# Patient Record
Sex: Female | Born: 1980 | Race: White | Hispanic: Yes | Marital: Married | Smoking: Never smoker
Health system: Southern US, Community
[De-identification: ages and names within clinical notes are randomized; demographics above are authoritative.]

## PROBLEM LIST (undated history)

## (undated) ENCOUNTER — Inpatient Hospital Stay (HOSPITAL_COMMUNITY): Payer: Self-pay

## (undated) DIAGNOSIS — O24419 Gestational diabetes mellitus in pregnancy, unspecified control: Secondary | ICD-10-CM

## (undated) DIAGNOSIS — Z789 Other specified health status: Secondary | ICD-10-CM

## (undated) HISTORY — DX: Other specified health status: Z78.9

## (undated) HISTORY — DX: Gestational diabetes mellitus in pregnancy, unspecified control: O24.419

---

## 2008-03-05 ENCOUNTER — Encounter: Payer: Self-pay | Admitting: Family Medicine

## 2008-03-05 ENCOUNTER — Ambulatory Visit: Payer: Self-pay | Admitting: Family Medicine

## 2008-03-05 LAB — CONVERTED CEMR LAB
ABO/RH(D): O POS
Basophils Absolute: 0 10*3/uL (ref 0.0–0.1)
Basophils Relative: 0 % (ref 0–1)
Eosinophils Absolute: 0.3 10*3/uL (ref 0.0–0.7)
Hepatitis B Surface Ag: NEGATIVE
MCHC: 32.7 g/dL (ref 30.0–36.0)
MCV: 87.5 fL (ref 78.0–100.0)
Neutrophils Relative %: 69 % (ref 43–77)
Platelets: 317 10*3/uL (ref 150–400)
RDW: 13.7 % (ref 11.5–15.5)
WBC: 12.1 10*3/uL — ABNORMAL HIGH (ref 4.0–10.5)

## 2008-03-12 ENCOUNTER — Encounter: Payer: Self-pay | Admitting: Family Medicine

## 2008-03-12 ENCOUNTER — Ambulatory Visit: Payer: Self-pay | Admitting: Family Medicine

## 2008-03-12 LAB — CONVERTED CEMR LAB
Chlamydia, DNA Probe: NEGATIVE
GC Probe Amp, Genital: NEGATIVE

## 2008-03-16 ENCOUNTER — Ambulatory Visit: Payer: Self-pay | Admitting: Family Medicine

## 2008-03-26 ENCOUNTER — Ambulatory Visit: Payer: Self-pay | Admitting: Family Medicine

## 2008-04-08 ENCOUNTER — Ambulatory Visit: Payer: Self-pay | Admitting: Family Medicine

## 2008-04-08 ENCOUNTER — Encounter: Payer: Self-pay | Admitting: Family Medicine

## 2008-05-04 ENCOUNTER — Ambulatory Visit: Payer: Self-pay | Admitting: Family Medicine

## 2008-05-04 ENCOUNTER — Encounter: Payer: Self-pay | Admitting: Family Medicine

## 2008-05-04 DIAGNOSIS — K219 Gastro-esophageal reflux disease without esophagitis: Secondary | ICD-10-CM | POA: Insufficient documentation

## 2008-05-05 ENCOUNTER — Encounter: Payer: Self-pay | Admitting: Family Medicine

## 2008-06-03 ENCOUNTER — Ambulatory Visit: Payer: Self-pay | Admitting: Family Medicine

## 2008-06-29 ENCOUNTER — Ambulatory Visit: Payer: Self-pay | Admitting: Family Medicine

## 2008-07-13 ENCOUNTER — Ambulatory Visit: Payer: Self-pay | Admitting: Family Medicine

## 2008-07-13 ENCOUNTER — Encounter: Payer: Self-pay | Admitting: Family Medicine

## 2008-07-13 LAB — CONVERTED CEMR LAB

## 2008-07-21 ENCOUNTER — Ambulatory Visit: Payer: Self-pay | Admitting: Family Medicine

## 2008-07-21 ENCOUNTER — Encounter: Payer: Self-pay | Admitting: Family Medicine

## 2008-07-29 ENCOUNTER — Ambulatory Visit: Payer: Self-pay | Admitting: Family Medicine

## 2008-08-13 ENCOUNTER — Encounter: Payer: Self-pay | Admitting: Family Medicine

## 2008-08-13 ENCOUNTER — Ambulatory Visit: Payer: Self-pay | Admitting: Family Medicine

## 2008-08-13 DIAGNOSIS — K649 Unspecified hemorrhoids: Secondary | ICD-10-CM | POA: Insufficient documentation

## 2008-08-13 LAB — CONVERTED CEMR LAB
Chlamydia, DNA Probe: NEGATIVE
Whiff Test: NEGATIVE

## 2008-08-26 ENCOUNTER — Ambulatory Visit: Payer: Self-pay | Admitting: Family Medicine

## 2008-09-09 ENCOUNTER — Encounter: Payer: Self-pay | Admitting: Family Medicine

## 2008-09-09 ENCOUNTER — Ambulatory Visit: Payer: Self-pay | Admitting: Family Medicine

## 2008-09-09 LAB — CONVERTED CEMR LAB
Chlamydia, DNA Probe: NEGATIVE
GC Probe Amp, Genital: NEGATIVE

## 2008-09-10 ENCOUNTER — Encounter: Payer: Self-pay | Admitting: Family Medicine

## 2008-09-14 ENCOUNTER — Ambulatory Visit: Payer: Self-pay | Admitting: Family Medicine

## 2008-09-21 ENCOUNTER — Ambulatory Visit: Payer: Self-pay | Admitting: Family Medicine

## 2008-09-27 ENCOUNTER — Ambulatory Visit: Payer: Self-pay | Admitting: Family Medicine

## 2008-10-06 ENCOUNTER — Encounter: Payer: Self-pay | Admitting: Family Medicine

## 2008-10-06 ENCOUNTER — Ambulatory Visit: Payer: Self-pay | Admitting: Obstetrics & Gynecology

## 2008-10-06 ENCOUNTER — Ambulatory Visit: Payer: Self-pay | Admitting: Family Medicine

## 2008-10-06 ENCOUNTER — Inpatient Hospital Stay (HOSPITAL_COMMUNITY): Admission: AD | Admit: 2008-10-06 | Discharge: 2008-10-11 | Payer: Self-pay | Admitting: Obstetrics & Gynecology

## 2008-10-08 ENCOUNTER — Encounter: Payer: Self-pay | Admitting: Obstetrics & Gynecology

## 2008-11-25 ENCOUNTER — Ambulatory Visit: Payer: Self-pay | Admitting: Family Medicine

## 2008-12-01 ENCOUNTER — Telehealth: Payer: Self-pay | Admitting: Family Medicine

## 2009-01-28 ENCOUNTER — Ambulatory Visit: Payer: Self-pay | Admitting: Family Medicine

## 2009-02-11 ENCOUNTER — Ambulatory Visit: Payer: Self-pay | Admitting: Family Medicine

## 2009-02-28 ENCOUNTER — Ambulatory Visit: Payer: Self-pay | Admitting: Family Medicine

## 2009-02-28 DIAGNOSIS — L600 Ingrowing nail: Secondary | ICD-10-CM | POA: Insufficient documentation

## 2009-04-18 ENCOUNTER — Encounter: Payer: Self-pay | Admitting: Family Medicine

## 2009-04-18 ENCOUNTER — Ambulatory Visit: Payer: Self-pay | Admitting: Family Medicine

## 2009-04-22 ENCOUNTER — Ambulatory Visit: Payer: Self-pay | Admitting: Family Medicine

## 2009-06-17 ENCOUNTER — Ambulatory Visit: Payer: Self-pay | Admitting: Family Medicine

## 2009-06-23 ENCOUNTER — Ambulatory Visit: Payer: Self-pay | Admitting: Family Medicine

## 2009-06-23 ENCOUNTER — Encounter: Payer: Self-pay | Admitting: Family Medicine

## 2009-06-30 ENCOUNTER — Ambulatory Visit: Payer: Self-pay | Admitting: Family Medicine

## 2009-07-21 ENCOUNTER — Ambulatory Visit: Payer: Self-pay | Admitting: Family Medicine

## 2010-06-06 NOTE — Assessment & Plan Note (Signed)
Summary: remove toenail/per briscoe/eo   Vital Signs:  Patient profile:   30 year old female Height:      61 inches Weight:      125 pounds BMI:     23.70 BSA:     1.55 Temp:     98.5 degrees F Pulse rate:   71 / minute BP sitting:   111 / 73  Vitals Entered By: Jone Baseman CMA (June 23, 2009 8:40 AM) CC: toe nail removal Is Patient Diabetic? No Pain Assessment Patient in pain? yes     Location: toe Intensity: 4   Primary Care Provider:  Delbert Harness  CC:  toe nail removal.  History of Present Illness: 1) Right ingrown 1st toe: Seen on 06/17/09 for re-infected right first toe. First seen  ~ 2 months ago for ingrown toenail w/ swelling and redness x 2 months; had partial toenail removal., started on Bacrtim at that time which "made her feel warm". She was started on Keflex at visit on 06/17/09 but did not take "because her toe felt better". Her sweling and redness and purulent drainage has now increased. Denies fever, chills, emesis.     Habits & Providers  Alcohol-Tobacco-Diet     Tobacco Status: never  Current Medications (verified): 1)  Pre-Natal  Tabs (Prenatal Multivit-Min-Fe-Fa) .... Take One Daily 2)  Keflex 500 Mg Caps (Cephalexin) .... Take One Tablet Three Times A Day For 7 Days.  Allergies (verified): 1)  ! Bactrim  Physical Exam  General:  Well-developed,well-nourished,in no acute distress; alert,appropriate and cooperative throughout examination.  Here today with FOB and infant.  Interpreter present. Skin:  Right: ingrown toenail  on lateral edge of great toe with paronychia with some purulent drainage.  Left: medial nail of edge growing back 3 mm with no  evidence of infection.   No extension of erythema past local area of inflammation.   Impression & Recommendations:  Problem # 1:  INGROWN TOENAIL, INFECTED (ICD-703.0) Removed right 1st toenail lateral edge as above. Follow up one week. Red flags reviewed, instructions in Spanish.    Complete Medication List: 1)  Pre-natal Tabs (Prenatal multivit-min-fe-fa) .... Take one daily  Patient Instructions: 1)  Fue un placer verle hoy.  Se saco' una parte de la una del dedo gordo del pie derecho hoy.  2)  Debe tomar ibuprofeno tabletas de 200mg , tome 2 a 3 tabletas cuando llegue en la casa, para ayudar a Human resources officer despues que la anestesia se le disminuya. 3)  En 24 horas, puede quitar la benda del dedo y Games developer a Chief Financial Officer en agua tibia con sales de [place], tres veces diario. 4)  Si el dedo se pone rojo, si se le sale pus, o si el dolor se le Moore , debe llamarnos [phone] 5)  Elfredia Nevins que venga para una cita en una semana para chequearle el dedo.   6)  FOLLOWUP APPT IN 1 WEEK WITH DR Wallene Huh OR BRISCOE   Procedure Note  Nail Treatment: The patient complains of pain, redness, inflammation, tenderness, swelling, and discharge but denies fever. Date of onset: 04/06/2009 Onset of lesion: 2 months Indication: paronychia Consent signed: yes  Procedure # 1: nail bed removal w/chemical matrixectomy    Region: lateral    Location: right 1st toe    Anesthesia: 5.0 ml 2% lidocaine w/o epinephrine    Comment: Procedure explained w/ risks and benefits. Patient voiced understanding, signed consent. Prep as above. Rubber band tourniquet to aid hemostasis applied for  5 minutes during procedure. Tolerated procedure well.   Cleaned and prepped with: alcohol and betadine Wound dressing: bacitracin and bulky gauze dressing Instructions: RTC in 7-10 days Additional Instructions: as per instruction sheet    Appended Document: Orders Update    Clinical Lists Changes  Orders: Added new Service order of Nail excision, partial or complete, permanent (11750) - Signed

## 2010-06-06 NOTE — Miscellaneous (Signed)
Summary: Procedures consent  Procedures consent   Imported By: De Nurse 07/13/2009 11:17:20  _____________________________________________________________________  External Attachment:    Type:   Image     Comment:   External Document

## 2010-06-06 NOTE — Assessment & Plan Note (Signed)
Summary: FU/KH   Vital Signs:  Patient profile:   30 year old female Height:      61 inches Weight:      124.7 pounds BMI:     23.65 Temp:     98.1 degrees F oral Pulse rate:   109 / minute BP sitting:   123 / 85  (right arm) Cuff size:   regular  Vitals Entered By: Garen Grams LPN (June 30, 2009 2:41 PM) CC: f/u infected toenail Is Patient Diabetic? No Pain Assessment Patient in pain? no        Primary Care Provider:  Delbert Harness  CC:  f/u infected toenail.  History of Present Illness: 1) Right ingrown 1st toe: Seen on 06/23/09 for re-infected right first toe. First seen  ~ 2 months ago for ingrown toenail w/ swelling and redness x 2 months; partial toenail removal on 2/17/ Tolerated procedure well.  Her sweling and redness and purulent drainage has now improved. Denies fever, chills, emesis, pain.     Habits & Providers  Alcohol-Tobacco-Diet     Tobacco Status: never  Allergies: 1)  ! Bactrim  Physical Exam  General:  pleasant, Spanish speaking female, NAD  Extremities:  left great toe s/p lateral partial nail removal w/ matricectomy. Some granulation tissue present. No signs of infection, purulence or erythema or edema.    Impression & Recommendations:  Problem # 1:  INGROWN TOENAIL, INFECTED (ICD-703.0) Assessment Improved  s/p lateral partial 1st toenail removal for above. Improved. Instructions on foot care as per prior instructions. Follow up with PCP as needed.   Orders: [facility]- Est Level  3 (16109)  Complete Medication List: 1)  Pre-natal Tabs (Prenatal multivit-min-fe-fa) .... Take one daily

## 2010-06-06 NOTE — Assessment & Plan Note (Signed)
Summary: depo/bmc  Nurse Visit   Allergies: 1)  ! Bactrim  Medication Administration  Injection # 1:    Medication: Depo-Provera 150mg     Diagnosis: CONTRACEPTIVE MANAGEMENT (ICD-V25.09)    Route: IM    Site: R deltoid    Exp Date: 09/2011    Lot #: [id number]    Mfr: [email or web address]    Comments: next depo due June 2 thru October 20, 2009    Patient tolerated injection without complications    Given by: Theresia Lo RN (July 21, 2009 9:53 AM)  Orders Added: 1)  Depo-Provera 150mg  [J1055] 2)  Admin of Intranasal/Oral Vaccine [phone]   Medication Administration  Injection # 1:    Medication: Depo-Provera 150mg     Diagnosis: CONTRACEPTIVE MANAGEMENT (ICD-V25.09)    Route: IM    Site: R deltoid    Exp Date: 09/2011    Lot #: [id number]    Mfr: [email or web address]    Comments: next depo due June 2 thru October 20, 2009    Patient tolerated injection without complications    Given by: Theresia Lo RN (July 21, 2009 9:53 AM)  Orders Added: 1)  Depo-Provera 150mg  [J1055] 2)  Admin of Intranasal/Oral Vaccine [24401]  Appended Document: depo/bmc

## 2010-06-06 NOTE — Assessment & Plan Note (Signed)
Summary: infected toe nail   Vital Signs:  Patient profile:   30 year old female Height:      61 inches Weight:      124 pounds BMI:     23.51 Temp:     98.2 degrees F oral Pulse rate:   76 / minute BP sitting:   119 / 77  (right arm) Cuff size:   regular  Vitals Entered By: Garen Grams LPN (June 17, 2009 10:24 AM) CC: right great townail infected Is Patient Diabetic? No Pain Assessment Patient in pain? no        Primary Care Provider:  Delbert Harness  CC:  right great townail infected.  History of Present Illness: 30 yo here for re-infected right great toe.  2 months ago was seen in office and had bilateral partial toenail removal for ingrown toenails.  Had been healing well but 2 weeks ago become reinfected.  Doing warm soaks but was unable to get floss or cotton under edge.  Other great toe is growing out well.  no fevers, chills.  Last time was given bactrim and said it made her feel warm.  Habits & Providers  Alcohol-Tobacco-Diet     Tobacco Status: never  Allergies: No Known Drug Allergies PMH-FH-SH reviewed-no changes except otherwise noted  Review of Systems      See HPI General:  Denies chills and fever. Derm:  Complains of changes in color of skin.  Physical Exam  General:  Well-developed,well-nourished,in no acute distress; alert,appropriate and cooperative throughout examination.  Here today with FOB and infant.  Interpreter present. Skin:  Right: ingrown toenail  on lateral edge of great toe with paronychia with some purulent drainage.  Left: medial nail of edge growing back 3 mm with no  evidence of infection.   No extension of erythema past local area of inflammation.   Impression & Recommendations:  Problem # 1:  INGROWN TOENAIL, INFECTED (ICD-703.0) Will add bactrim to medication intolerance list.  Will prescribe keflex today with neosporin.  Will return early next week once calmed for toenail removal.  Precepted with Dr. Mauricio Po.  Her  updated medication list for this problem includes:    Keflex 500 Mg Caps (Cephalexin) .Marland Kitchen... Take one tablet three times a day for 7 days.  Orders: [facility]- Est Level  3 (16109)  Complete Medication List: 1)  Pre-natal Tabs (Prenatal multivit-min-fe-fa) .... Take one daily 2)  Keflex 500 Mg Caps (Cephalexin) .... Take one tablet three times a day for 7 days.  Patient Instructions: 1)  Fue un placer verle hoy.  2)  Para la una encarnada e infectada, recomiendo que enguague el pie en agua tibia con sales curativas [employer], que se compra en la farmacia en un carton que parece un carton de [place]).  Haga esto varias veces por dia.  3)  Puede aplicar un antibiotico, Neosporin, despues de los banos.  Este se puede comprar sin receta.  4)  Le estamos recetando un antibiotico de via oral, Keflex 500mg , que se debe tomar una tableta tres veces por dia (un total de tres tabletas en un dia).   5)  La receta fue mandada a su farmacia: [facility] en [address] y [address]. 6)  Le marcamos una cita para volver a quitarse la una al comienzo de la semana que viene. 7)  MAKE APPT FOR EARLY NEXT WEEK WITH DR Wallene Huh. 8)  EL ANTIBIOTICO QUE UD RECIBIO' LA VEZ PASADA ES BACTRIM, (el otro nombre es sulfamethoxazole-trimethoprim).  Prescriptions: KEFLEX 500 MG CAPS (CEPHALEXIN) take one tablet three times a day for 7 days.  #21 x 0   Entered and Authorized by:   Delbert Harness MD   Signed by:   Delbert Harness MD on 06/17/2009   Method used:   Electronically to        [facility] [address] [id number] (retail)       [address] [place], [place]  [address]       Ph: [phone]       Fax: [phone]   RxID:   [id number]

## 2010-06-23 ENCOUNTER — Encounter: Payer: Self-pay | Admitting: *Deleted

## 2010-08-14 LAB — CBC
HCT: 20 % — ABNORMAL LOW (ref 36.0–46.0)
HCT: 20.9 % — ABNORMAL LOW (ref 36.0–46.0)
HCT: 21 % — ABNORMAL LOW (ref 36.0–46.0)
HCT: 29 % — ABNORMAL LOW (ref 36.0–46.0)
Hemoglobin: 6.6 g/dL — CL (ref 12.0–15.0)
Hemoglobin: 6.8 g/dL — CL (ref 12.0–15.0)
Hemoglobin: 6.9 g/dL — CL (ref 12.0–15.0)
Hemoglobin: 9.4 g/dL — ABNORMAL LOW (ref 12.0–15.0)
MCHC: 32.5 g/dL (ref 30.0–36.0)
MCHC: 32.5 g/dL (ref 30.0–36.0)
MCHC: 32.9 g/dL (ref 30.0–36.0)
MCHC: 33 g/dL (ref 30.0–36.0)
MCV: 72.1 fL — ABNORMAL LOW (ref 78.0–100.0)
MCV: 72.2 fL — ABNORMAL LOW (ref 78.0–100.0)
MCV: 72.4 fL — ABNORMAL LOW (ref 78.0–100.0)
MCV: 72.4 fL — ABNORMAL LOW (ref 78.0–100.0)
Platelets: 222 10*3/uL (ref 150–400)
Platelets: 264 10*3/uL (ref 150–400)
Platelets: 306 10*3/uL (ref 150–400)
Platelets: 306 10*3/uL (ref 150–400)
RBC: 2.78 MIL/uL — ABNORMAL LOW (ref 3.87–5.11)
RBC: 2.9 MIL/uL — ABNORMAL LOW (ref 3.87–5.11)
RBC: 2.9 MIL/uL — ABNORMAL LOW (ref 3.87–5.11)
RBC: 4 MIL/uL (ref 3.87–5.11)
RDW: 17.4 % — ABNORMAL HIGH (ref 11.5–15.5)
RDW: 17.7 % — ABNORMAL HIGH (ref 11.5–15.5)
RDW: 17.9 % — ABNORMAL HIGH (ref 11.5–15.5)
RDW: 18.1 % — ABNORMAL HIGH (ref 11.5–15.5)
WBC: 10.3 10*3/uL (ref 4.0–10.5)
WBC: 12.1 10*3/uL — ABNORMAL HIGH (ref 4.0–10.5)
WBC: 13.5 10*3/uL — ABNORMAL HIGH (ref 4.0–10.5)
WBC: 14.3 10*3/uL — ABNORMAL HIGH (ref 4.0–10.5)

## 2010-08-14 LAB — GC/CHLAMYDIA PROBE AMP, GENITAL
Chlamydia, DNA Probe: NEGATIVE
GC Probe Amp, Genital: NEGATIVE

## 2010-08-14 LAB — RPR: RPR Ser Ql: NONREACTIVE

## 2010-08-14 LAB — WET PREP, GENITAL
Clue Cells Wet Prep HPF POC: NONE SEEN
Trich, Wet Prep: NONE SEEN

## 2010-08-17 LAB — GLUCOSE, CAPILLARY: Glucose-Capillary: 87 mg/dL (ref 70–99)

## 2010-09-19 NOTE — Op Note (Signed)
[name], Cruz       ACCOUNT NO.:  [id number]   MEDICAL RECORD NO.:  [id number]          PATIENT TYPE:  INP   LOCATION:  9111                          FACILITY:  [facility]   PHYSICIAN:  Scheryl Darter, MD       DATE OF BIRTH:  Jul 02, 1980   DATE OF PROCEDURE:  10/08/2008  DATE OF DISCHARGE:                               OPERATIVE REPORT   PREOPERATIVE DIAGNOSES:  1. Intrauterine pregnancy at 59 and 6/7th weeks' gestational age.  2. Cephalopelvic disproportion.  3. Chorioamnionitis.  4. Prolonged rupture of membranes.   POSTOPERATIVE DIAGNOSES:  1. Intrauterine pregnancy at 12 and 6/7th weeks' gestational age.  2. Cephalopelvic disproportion.  3. Chorioamnionitis.  4. Prolonged rupture of membranes.   PROCEDURE:  Primary low-transverse cesarean section.   SURGEON:  Scheryl Darter, MD   ASSISTANT:  Odie Sera, DO   ANESTHESIA:  Epidural.   INDICATIONS FOR PROCEDURE:  Ms. [name] is a 30 year old gravida 1  now para 1-0-0-1 admitted at 58 and 5/7th weeks gestational age with  ruptured membranes.  Her labor progressed slowly over 24 hours and after  3-1/2 hours of pushing efforts with minimal change in station of the  baby, decision was made to apply forceps.  Forceps were applied and with  the first effort forceps, we were unable to move the baby at all through  the birth canal.  The incision was then made to proceed with a cesarean  section due to presumed cephalopelvic disproportion.  Of note, during  the patient's pushing efforts, she developed a temperature and  antibiotics, ampicillin and gentamicin were started.  The patient was  then counseled on the risks and benefits of cesarean section to include,  but not limited to bleeding, infection, and damage to internal organs.  The patient agreed to proceed with the surgery.   DESCRIPTION OF PROCEDURE:  The patient was taken to the operating room  where epidural anesthesia was redosed.  Appropriate anesthesia  was  confirmed.  The patient was then prepped and draped in the usual sterile  manner.  A time-out was conducted.  A Pfannenstiel incision was made in  the skin and continued through the subcutaneous layers down the fascia.  The fascia was incised in the midline.  The fascial incision was  extended laterally using manual traction.  The fascia was then dissected  off the underlying rectus muscles both superior and inferior to the  incision.  The rectus muscles were then separated in the midline with  manual traction.  The peritoneum was entered bluntly and the peritoneal  opening was separated with manual traction.  The bladder blade was  placed.  Appropriate entry to the uterus was obtained.  A transverse  incision was made in the lower uterine segment with a scalpel.  The  incision was carried through the myometrial layers until meconium-  stained amniotic fluid was noted.  The uterine incision was then  extended laterally using manual traction.  The fetal head was grasped  and elevated out of the pelvis.  The bladder blade was removed and the  fetal head was delivered with the assistance of fundal pressure.  The  head delivered in a occiput transverse position and the mouth and nares  were bulb suctioned.  The shoulders followed by rest of corpus were then  delivered without difficulty.  The mouth and nares were bulb suctioned  again.  The cord was clamped and cut and the infant was handed to the  awaiting NICU staff with spontaneous cry, good color, and good tone.  The baby's weight was 9 pounds and 4 ounces and Apgars were 9 and 9.  The placenta then delivered with the assistance of fundal massage.  The  placenta was intact and had three-vessel cord.  The uterus was then  cleared of clots and debris using a dry lap sponge.  The uterine  incision was then closed using a two-layer closure.  The first layer is  a running interlocking stitch using a 0 Vicryl suture.  The second layer   using the same suture was an imbricating layer.  Good hemostasis was  obtained of the uterine incision.  The abdomen was then irrigated with  warm saline.  Again good hemostasis was noted of the uterine incision.  The peritoneum was then closed using 0 Vicryl in a running  noninterlocking fashion.  The fascia was then closed using the same  suture in a running noninterlocking fashion.  The subcutaneous layers  were irrigated with a wet lap sponge.  The skin was then closed using 4-  0 Vicryl in a running subcuticular manner.  Pressure dressing was  applied.  All sponge, instrument, and needle counts were correct x2.   FINDINGS:  1. Meconium-stained amniotic fluid.  2. Viable female infant.  3. Grossly normal bilateral tubes and ovaries.   SPECIMENS:  Placenta.   DISPOSITION:  Pathology.   ESTIMATED BLOOD LOSS:  800 mL.   COMPLICATIONS:  There are no immediate complication.   DISPOSITION:  The patient taken to the PACU in good condition.      Odie Sera, DO      Scheryl Darter, MD  Electronically Signed    MC/MEDQ  D:  10/08/2008  T:  10/08/2008  Job:  [id number]

## 2010-09-22 NOTE — Discharge Summary (Signed)
[name], [name]       ACCOUNT NO.:  [id number]   MEDICAL RECORD NO.:  [id number]          PATIENT TYPE:  INP   LOCATION:  9111                          [facility]   PHYSICIAN:  Horton Chin, MD DATE OF BIRTH:  Jul 01, 1980   DATE OF ADMISSION:  10/06/2008  DATE OF DISCHARGE:  10/11/2008                               DISCHARGE SUMMARY   PRIMARY CARE [name]:  Dr. Delbert Harness at [facility].   DISCHARGE MEDICATIONS:  1. Ibuprofen 600 mg.  2. Iron sulfate 325 mg twice a day.  3. Prenatal vitamins.  4. Depo-Provera shot given on discharge.   DISCHARGE DIAGNOSES:  1. Gravida 1, para 1, delivered a viable female at 26 and 6.  2. Chorioamnionitis.  3. Cephalopelvic disproportion.  4. Status post primary low transverse cesarean section.   PERTINENT LABORATORY DATA:  Hemoglobin on admission was 9.4, on postop  day #1 trended down to 6.8.  On postop day #2 the patient was started on  iron supplements and hemoglobin was trending upward 6.9.  The patient'Erika  anemia due to blood loss during surgery.  The patient was asymptomatic  and vital signs stable prior to discharge.  Mother O+, RPR nonreactive.   HOSPITAL COURSE:  This is 30 year old G1, P0, who presented to [facility] at 39 and 5 weeks with premature rupture of membranes possibly  3 days prior to admission.  The patient was admitted with expectant  management and when she failed to progress, her labor was augmented with  Cytotec and then Pitocin.  The patient had a slow progression of active  labor with good fetus tolerated of labor.  The mother was noted to be  afebrile ampicillin and gentamicin were started for presumed  chorioamnionitis.  After 24 hours of labor and 3-1/2 hours of pushing  with no significant change in the baby'Erika station, forceps were applied  with no descent of the fetus.  It was decided that the patient would  proceed with cesarean section.   Please see dictated  operative report for full details.  The patient'Erika  postpartum course was notable only for a decrease in her hemoglobin  postoperatively with an estimated blood loss of 800 mL.  The patient was  asymptomatic and thus  was not transfused.  Hemoglobin showed an upward trend prior to  discharge.  The patient was discharged on POD #3 before her baby as the  infant was transferred to NICU for respiratory distress.  The mother  plans to breastfeed.   The patient to follow up at [facility] for postpartum  appointment on July 22 at 9 a.m.      Delbert Harness, MD      Horton Chin, MD  Electronically Signed    KB/MEDQ  D:  10/21/2008  T:  10/21/2008  Job:  [id number]

## 2011-11-29 ENCOUNTER — Other Ambulatory Visit: Payer: Self-pay | Admitting: Family Medicine

## 2011-11-29 ENCOUNTER — Other Ambulatory Visit: Payer: Self-pay

## 2011-11-29 DIAGNOSIS — Z3490 Encounter for supervision of normal pregnancy, unspecified, unspecified trimester: Secondary | ICD-10-CM

## 2011-11-29 NOTE — Progress Notes (Unsigned)
Prenatal labs drawn and sent to [facility]

## 2011-11-30 LAB — OBSTETRIC PANEL
Antibody Screen: NEGATIVE
Eosinophils Absolute: 0.3 10*3/uL (ref 0.0–0.7)
Eosinophils Relative: 3 % (ref 0–5)
Lymphs Abs: 2 10*3/uL (ref 0.7–4.0)
MCH: 28.7 pg (ref 26.0–34.0)
MCV: 83.6 fL (ref 78.0–100.0)
Monocytes Absolute: 0.7 10*3/uL (ref 0.1–1.0)
Platelets: 312 10*3/uL (ref 150–400)
RBC: 4.15 MIL/uL (ref 3.87–5.11)
Rh Type: POSITIVE

## 2011-12-01 LAB — CULTURE, OB URINE
Colony Count: NO GROWTH
Organism ID, Bacteria: NO GROWTH

## 2011-12-06 ENCOUNTER — Encounter: Payer: Self-pay | Admitting: Family Medicine

## 2011-12-12 ENCOUNTER — Encounter: Payer: Self-pay | Admitting: Family Medicine

## 2011-12-12 ENCOUNTER — Ambulatory Visit (INDEPENDENT_AMBULATORY_CARE_PROVIDER_SITE_OTHER): Payer: Self-pay | Admitting: Family Medicine

## 2011-12-12 ENCOUNTER — Other Ambulatory Visit (HOSPITAL_COMMUNITY)
Admission: RE | Admit: 2011-12-12 | Discharge: 2011-12-12 | Disposition: A | Discharge status: Home / Self Care | Payer: Self-pay | Source: Ambulatory Visit | Attending: Family Medicine | Admitting: Family Medicine

## 2011-12-12 VITALS — BP 108/68 | Temp 98.0°F | Wt 127.0 lb

## 2011-12-12 DIAGNOSIS — Z01419 Encounter for gynecological examination (general) (routine) without abnormal findings: Secondary | ICD-10-CM | POA: Insufficient documentation

## 2011-12-12 DIAGNOSIS — Z3480 Encounter for supervision of other normal pregnancy, unspecified trimester: Secondary | ICD-10-CM

## 2011-12-12 DIAGNOSIS — Z113 Encounter for screening for infections with a predominantly sexual mode of transmission: Secondary | ICD-10-CM | POA: Insufficient documentation

## 2011-12-12 DIAGNOSIS — Z3490 Encounter for supervision of normal pregnancy, unspecified, unspecified trimester: Secondary | ICD-10-CM

## 2011-12-12 DIAGNOSIS — O0990 Supervision of high risk pregnancy, unspecified, unspecified trimester: Secondary | ICD-10-CM | POA: Insufficient documentation

## 2011-12-12 NOTE — Progress Notes (Signed)
  Subjective:    [name] is being seen today for her first obstetrical visit.  This is a planned pregnancy. She is at [redacted]w[redacted]d gestation. Her obstetrical history is significant for advanced maternal age. Relationship with FOB: spouse, living together. Patient does intend to breast feed. Pregnancy history fully reviewed.  Patient reports nausea, no bleeding, no contractions, no cramping and no leaking.  Review of Systems:   Review of Systems  Objective:     BP 108/68  Temp 98 F (36.7 C)  Wt 127 lb (57.607 kg)  LMP 08/24/2011 Physical Exam  Exam    Assessment:    Pregnancy: G2P1000 Patient Active Problem List  Diagnosis  . HEMORRHOIDS  . GASTROESOPHAGEAL REFLUX DISEASE  . Pregnancy  Gen: NAD, slightly overweight HEENT: MMM, EOMI, PERRL, no adenopathy CV: RRR, no murmurs Resp: CTABL Abd: SNTND, gravid Genital: Normal external/internal female genitalia.  Without lesions.  Cervix is parous with no friability or CMT.  It is significantly posterior. Ext: No edema, 2+ pulses     Plan:     Initial labs drawn. Prenatal vitamins. Problem list reviewed and updated. AFP3 discussed: undecided. Role of ultrasound in pregnancy discussed; fetal survey: requested. Amniocentesis discussed: not indicated. Follow up in 5 weeks. 50% of 60 min visit spent on counseling and coordination of care.    Kealani Leckey 12/12/2011

## 2011-12-12 NOTE — Progress Notes (Signed)
Patient was supposed to get 1 hour GTT due to h/o DM in family but left before this was performed.

## 2011-12-12 NOTE — Patient Instructions (Addendum)
Embarazo - Primer trimestre (Pregnancy - First Trimester) Durante el acto sexual, millones de espermatozoides ingresan a la vagina. Slo Education administrator y Economist en vulo femenino mientras se encuentre en las Trompas de [place]. Una semana despus, el huevo fertilizado se implanta en la pared del tero. Un embrin comienza a desarrollarse y se convierte en un beb. Entre la 6 y la 8 semanas se forman los ojos y el rostro y el latido cardaco se detecta con Nurse, adult. Hacia el final de la 12 semana (primer trimestre) se han formado todos los rganos del beb. Ahora que est embarazada, desear hacer todo lo posible para tener un beb sano. Dos de las cosas ms importantes son: Winferd Humphrey buena atencin prenatal y seguir las indicaciones del profesional que la asiste. La atencin prenatal incluye toda la asistencia mdica que usted recibe antes del nacimiento del beb. Se lleva a cabo para prevenir problemas durante el embarazo y [place]. EXAMENES PRENATALES  Durante los controles prenatales, le tomarn la presin arterial, verificarn su peso y [place] harn [place] de [place]. Todo esto se realiza para asegurar que usted progresa de [place] normal y Office manager.   Una mujer embarazada ganar de 25 a 35 libras (8 a 12 kilos) Academic librarian. Sin embargo, si tiene sobrepeso o bajo peso el mdico le aconsejar acerca de [place].   Entre los [employer] solicitarn anlisis de [place], cultivos de cuello de [place], [place] y [place]. Estas pruebas se realizan para controlar su salud y la del beb. Se recomienda realizar todos los [employer] y tambin pruebas para la deteccin del VIH, si usted lo autoriza. Este es el virus que causa el [place]. Estas pruebas se realizan porque existen medicamentos que podrn administrarle para prevenir que el beb nazca con esta infeccin, si usted estuviera infectada y no lo supiera. Tambin se solicitan anlisis de sangre para conocer su  grupo sanguneo, infecciones previas y para Chief Operating Officer los glbulos rojos (hemoglobina).   Un bajo nivel de hemoglobina (anemia) es frecuente durante el embarazo. Para prevenirla, se administran hierro y vitaminas. En etapas posteriores del [employer] solicitarn anlisis de sangre para descartar diabetes y otros anlisis para Sales promotion account executive otros problemas. Los anlisis son necesarios para verificar que usted y el beb estn bien.   Necesitar otras pruebas que se realizan para asegurarse de que el beb est saludable.  CAMBIOS DURANTE EL PRIMER TRIMESTRE (LOS TRES PRIMEROS MESES DEL EMBARAZO). Su organismo atravesar numerosos cambios Academic librarian. Estos pueden variar de Neomia Dear persona a otra. Converse con el profesional que la asiste acerca los cambios que usted note y que la preocupen.  El perodo menstrual se detiene.   El vulo y los espermatozoides llevan los genes que determinan cmo seremos. Sus genes y los de su pareja forman un beb. Los genes del varn determinan si ser un nio o una nia.   No tome ningn medicamento a menos que se lo haya indicado el profesional que la asiste.   Aumentar la circunferencia de su cuerpo y se sentir hinchada.   Podr sentir nuseas o vmitos. Si los vmitos son incontrolables, comunquese con el profesional que la asiste.   Sus mamas comenzarn a agrandarse y estarn ms sensibles.   Los pezones salen hacia afuera y se oscurecen.   Mayor necesidad de Geographical information systems officer. Si siente dolor al orinar podra tener una infeccin urinaria.   Cansarse fcilmente.   Prdida del apetito.   Retorcijones por ciertos tipos de alimentos.  Al principio, podr ganar o perder algo de peso.   Podr sentir cambios emocionales cada da (alegra por el embarazo o preocupacin acerca de que haya algo mal en el embarazo o con el beb).   Podr tener sueos ms vvidos y fuertes.  INSTRUCCIONES PARA EL CUIDADO DOMICILIARIO  Es extremadamente importante que evite el  cigarrillo, el alcohol y las drogas no prescriptas durante el embarazo. Estas sustancias qumicas afectan la formacin y el desarrollo del beb. Evite estas sustancias qumicas durante todo el embarazo para asegurar el nacimiento de un beb sano.   Comience las consultas prenatales alrededor de la 12 semana de embarazo. Generalmente se programan cada mes al principio y se hacen ms frecuentes en los 2 ltimos meses antes del parto. Cumpla con las citas tal como se le indic. Siga las indicaciones del profesional con respecto a los medicamentos que toma, los anlisis de laboratorio, la actividad fsica y la dieta.   Durante el embarazo debe obtener nutrientes para usted y para su beb. Consuma alimentos balanceados. Elija alimentos como carne, pescado, leche y otros productos lcteos descremados, vegetales, frutas, panes integrales y cereales. El profesional le informar cul es el aumento de peso ideal.   Las nuseas matinales pueden aliviarse si come algunas galletitas saladas en la cama. Coma dos galletitas antes de levantarse por la maana. Tambin puede comer galletitas sin sal.   Tome 4  5 comidas pequeas por da en lugar de 3 comidas abundantes para evitar las nuseas y los vmitos.   Tambin puede prevenir las nuseas bebiendo lquidos entre comidas en lugar de hacerlo mientras come.   Si no tiene problemas, puede continuar manteniendo relaciones sexuales a lo largo de todo el embarazo. Algunos problemas pueden ser la prdida prematura de lquido amnitico de las membranas, hemorragias vaginales o dolor en el abdomen.   Realice actividad fsica todos los das, si no tiene restricciones. Consulte con el profesional que la asiste o con un fisioterapeuta si no sabe con certeza si determinados ejercicios son seguros. En los dos ltimos trimestres del embarazo puede ocurrir un aumento de peso mayor. La actividad fsica ayudar a:   Controlar su peso.   Mantenerse en forma.   Prepararse para  el parto y el trabajo de parto.   Ayuda a perder el peso ganado en el embarazo luego del parto.   Use un buen sostn o como los que se usan para hacer deportes para aliviar la sensibilidad de las mamas. Tambin puede serle til si lo usa mientras duerme.   Consulte cuando tendr el curso preparatorio para el parto. Comience las clases cuando se las ofrezcan.   No utilice la baera con agua caliente, baos turcos y saunas.   Colquese el cinturn de seguridad cuando conduzca. Este la proteger a usted y al beb en caso de accidente.   Evite comer carne cruda y el contacto con los utensilios y desperdicios de los gatos. Estos elementos contienen grmenes que pueden causar defectos de nacimiento en el beb.   El primer trimestre es un buen momento para visitar a su dentista y evaluar su salud dental. Es importante mantener los dientes limpios. Use un cepillo de dientes suave y cepllese con ms suavidad durante el embarazo.   Pida ayuda si tiene necesidades econmicas, de asesoramiento o nutricionales durante el embarazo. El profesional podr ayudarla con respecto a estas necesidades, o derivarla a otros especialistas.   No tome ningn medicamento a menos que se lo haya indicado el profesional que la   asiste.   Informe al profesional que lo asiste si es vctima de algn tipo de Advertising copywriter, ya sea mental o fsica.   Haga una lista de nmeros de telfono de Associate Professor de familiares, amigos, hospital, polica y bomberos.   Anote sus preguntas. Llvelas con usted en su prxima visita de control.   No utilice duchas vaginales.   No cruce las piernas.   Si ha estado parada durante largos perodos de [place], mueva los pies o realice pequeos pasos en crculo.   Podr tener secreciones vaginales que pueden requerir una compresa o [place]. No utilice tampones ni compresas con perfume.  EL CONSUMO DE MEDICAMENTOS Y DROGAS DURANTE EL EMBARAZO  Tome las vitaminas apropiadas para esta  etapa tal como se le indic. Las vitaminas deben contener 1 miligramo de cido flico. Guarde todas las vitaminas fuera del alcance de los nios. La ingestin de slo un par de vitaminas o tabletas que contengan hierro pueden ocasionar la [employer] en un beb o en un nio pequeo.   Evite el uso de medicamentos, inclusive hierbas, que no hayan sido prescriptos o indicados por el profesional que la asiste. Utilice los medicamentos de venta libre o de prescripcin para Chief Technology Officer, Environmental health practitioner o la [place], segn se lo indique el profesional que lo asiste. No utilice aspirina, ibuprofeno o naproxeno a menos que el profesional la autorice.   El consumo de alcohol se relaciona con ciertos defectos de nacimiento. Entre ellos el sndrome de alcoholismo fetal. Debe evitar el consumo de alcohol en cualquiera de sus formas. El cigarrillo causa nacimientos prematuros y bebs de [place].   Las drogas ocasionan terribles daos al beb. Estn absolutamente prohibidas. Un beb que nace de [employer], ser adicto al nacer. Ese beb tendr los mismos sntomas de abstinencia que un adulto.   No consuma drogas. Pueden daar gravemente al beb.   Converse con el mdico acerca de cualquier medicamento que est tomando y la razn por la cual los toma.  EL ABORTO ESPONTNEO ES UNA SITUACIN FRECUENTE Esto no significa que ha hecho las cosas mal. No es un motivo para preocuparse en caso de un nuevo embarazo. El profesional que la asiste la ayudar si tiene preguntas para formular. Si ha sufrido un aborto espontneo, Pension scheme manager someterse a Futures trader de curetaje, SOLICITE ATENCIN MDICA SI: Tiene alguna preocupacin durante el embarazo. Es mejor que llame para formular las preguntas si no puede esperar hasta la prxima visita, que sentirse preocupada por ellas. SOLICITE ATENCIN MDICA DE INMEDIATO SI:  La temperatura oral se eleva sin motivo por encima de 102 F (38.9 C) o segn le indique el profesional que lo  asiste.   Tiene una prdida de lquido por la vagina (canal de parto). Si sospecha una ruptura de las [place], tmese la temperatura y llame al profesional para informarlo sobre esto.   Observa unas pequeas manchas o una hemorragia vaginal. Notifique al profesional acerca de la cantidad y de cuntos apsitos est utilizando.   Presenta un olor desagradable en la secrecin vaginal y observa un cambio en el color.   Contina con las nuseas y no obtiene alivio de los remedios indicados. Vomita sangre o algo similar a la borra del caf.   Pierde ms de 1 Kg de peso en [employer].   Aumenta ms de 1 Kg en una semana y [address], las manos, los pies o las piernas hinchados.   Aumenta ms de 2,5 Kg en una semana (aunque  no tenga las manos, pies, piernas o el rostro hinchados).   Ha estado expuesta a la rubola y no ha sufrido la enfermedad. Ha estado expuesta a la quinta enfermedad o a la varicela.   Ha estado expuesta a la quinta enfermedad o a la varicela.   Presenta dolor abdominal. Las molestias en el ligamento redondo son Neomia Dear causa benigna (no cancerosa) frecuente de dolor abdominal durante el embarazo. El profesional que la asiste deber evaluarla.   Presenta dolor de Renne Musca, diarrea, dolor al orinar o le falta la respiracin.   Sufre una cada, un accidente automovilstico o algn traumatismo.   Sufre violencia fsica o mental en su hogar.  Document Released: 01/31/2005 Document Revised: 04/12/2011 [facility] Patient Information 2012 [place], [place].Psychiatrist (Pregnancy) Si planea quedar embarazada, es una buena idea concertar una cita de preconcepcin con el mdico para poder lograr un estilo de vida saludable ante de quedar embarazada. Esto incluye dieta, peso, ejercicio, el tomar vitaminas prenatales en especial cido flico (ayuda a prevenir defectos en el cerebro y la mdula espinal), evitar el alcohol, fumar, las drogas ilegales, problemas mdicos (diabetes,  convulsiones), historial familiar de problemas genticos, condiciones de trabajo e inmunizaciones. Es mejor tener conocimiento de estas cosas y Radio producer algo antes de quedar embarazada. Si est embarazada, es necesario que siga ciertas pautas para tener un beb sano. Es muy importante Education officer, environmental controles prenatales adecuados y seguir las indicaciones del profesional que la asiste. La atencin prenatal incluye toda la asistencia mdica que usted recibe antes del nacimiento del beb. Esto ayuda a Publishing copy y [place]. INSTRUCCIONES PARA EL CUIDADO DOMICILIARIO  Comience las consultas prenatales alrededor de la 12 semana de embarazo o lo antes posible. Al principio generalmente se programan cada mes. Se hacen ms frecuentes en los 2 ltimos meses antes del parto. Es importante que concurra a todas las citas con el profesional y siga sus instrucciones con Camera operator a los medicamentos que deba Chemical engineer, a la actividad fsica y a Psychologist, forensic.   Durante el embarazo debe obtener nutrientes para usted y para su beb. Consuma una dieta normal y bien balanceada. Elija alimentos como carne, pescado, [place] y otros productos lcteos, vegetales, frutas, panes integrales y Research officer, trade union cul es el aumento de [place] ideal, segn su peso y Risk analyst. Beba gran cantidad de lquidos. Trate de beber 8 vasos de lquidos por Futures trader.   El alcohol se asocia a cierto nmero de defectos del nacimiento, incluyendo el sndrome de alcoholismo fetal. Lo mejor es evitarlo completamente El cigarrillo causa nacimientos prematuros y bebs de bajo peso al Psychologist, clinical. El consumo de alcohol y nicotina durante el embarazo tambin aumentan marcadamente la probabilidad de que el nio sea qumicamente dependiente en etapas posteriores de su vida y puede contribuir al sndrome de muerte sbita infantil (SMSI)   No consuma drogas.   Solo tome medicamentos prescriptos o de venta libre que le haya recomendado  el profesional. Algunos medicamentos pueden causar problemas genticos y fsicos al beb   Las nuseas matinales pueden aliviarse si come Chiropodist en la cama. Coma dos galletitas antes de levantarse por la maana.   Las relaciones sexuales pueden continuarse hasta casi el final del embarazo, si no se presentan otros problemas como prdida prematura (antes de tiempo) de lquido amnitico, Restaurant manager, fast food vaginal, dolor durante las relaciones sexuales o dolor abdominal (en el vientre).   Practique ejercicios con regularidad. Consulte con el profesional que la asiste si no [place]  con certeza si determinados ejercicios son seguros.   No utilice la baera con agua caliente, baos turcos y saunas. Estos aumentan el riesgo de sufrir un desmayo o de prdida del conocimiento, y as Runner, broadcasting/film/video usted o el beb. La natacin es un buen ejercicio. Descanse todo lo que pueda e incluya una siesta despus de almorzar siempre que le sea posible, especialmente durante el tercer trimestre.   Evite los olores y las sustancias qumicas txicas.   No use zapatos de tacones altos, podra perder el equilibrio y caer.   No levante objetos de ms de 2,5 kg. Si levanta un objeto, flexione las piernas y los muslos, y no la espalda.   Evite los viajes largos, Paramedic trimestre.   Si debe viajar fuera de la ciudad o de su estado, lleve una copia de la historia clnica.  SOLICITE ATENCIN MDICA DE INMEDIATO SI:  La temperatura oral se eleva sin motivo por encima de 102 F (38.9 C) o segn le indique el profesional que lo asiste.   Tiene una prdida de lquido por la vagina. Si sospecha una ruptura de las [place], tmese la temperatura y llame al profesional para informarlo sobre esto.   Observa unas pequeas manchas o una hemorragia vaginal Notifique al profesional acerca de la cantidad y de cuntos apsitos est utilizando.   Contina teniendo nuseas y no obtiene alivio de los  [facility] han [place], o vomita sangre o una sustancia similar a la borra del caf.   Presenta un dolor en la zona superior del abdomen.   Siente molestias en el ligamento redondo en la parte abdominal baja. El profesional que la asiste Hydrologist.   Siente pequeas contracciones del tero (matriz)   No siente que el beb se mueve, o percibe menos movimientos que antes.   Siente dolor al [employer].   Brett Fairy hemorragia vaginal anormal.   Tiene diarrea persistente.   Sufre una cefalea grave.   Tiene problemas visuales.   Comienza a sentir debilidad muscular.   Se siente mareada o sufre un desmayo.   Comienza a sentir falta de aire.   Siente dolor en el pecho.   Sufre dolor en la espalda que se irradia hacia la pierna y el pie.   Siente latidos cardacos irregulares o la frecuencia cardaca es muy rpida.   Aumenta excesivamente de peso en un perodo breve (2,5 kg en 3 a 5 das)   Se ve envuelta en una situacin de violencia domstica.  Document Released: 01/31/2005 Document Revised: 04/12/2011 [facility] Patient Information 2012 [place], [place].

## 2011-12-17 ENCOUNTER — Encounter: Payer: Self-pay | Admitting: Family Medicine

## 2011-12-27 ENCOUNTER — Telehealth: Payer: Self-pay | Admitting: Family Medicine

## 2011-12-27 NOTE — Telephone Encounter (Signed)
Pt called to canceled [place] @ Dr. Gaynell Face because on Monday pt will have OC from [facility].  Pt will came to let us scan OC for future [place] appt. [employer]

## 2011-12-31 ENCOUNTER — Telehealth: Payer: Self-pay | Admitting: Family Medicine

## 2011-12-31 NOTE — Telephone Encounter (Signed)
Pt needs Sch a [place] in [facility], pt has OC and will bring the OC to be scan for our record.  [employer]

## 2012-01-01 NOTE — Addendum Note (Signed)
Addended by: Jimmy Footman K on: 01/01/2012 12:14 PM   Modules accepted: Orders

## 2012-01-01 NOTE — Telephone Encounter (Signed)
I set up patient an appointment for Friday 8/30 @ 8:00am patient to arrive @ 7:45am @ [facility]. They are going to schedule an interpreter for her.

## 2012-01-04 ENCOUNTER — Ambulatory Visit (HOSPITAL_COMMUNITY)
Admission: RE | Admit: 2012-01-04 | Discharge: 2012-01-04 | Disposition: A | Discharge status: Home / Self Care | Payer: Self-pay | Source: Ambulatory Visit | Attending: Family Medicine | Admitting: Family Medicine

## 2012-01-04 DIAGNOSIS — Z3689 Encounter for other specified antenatal screening: Secondary | ICD-10-CM | POA: Insufficient documentation

## 2012-01-04 DIAGNOSIS — Z3490 Encounter for supervision of normal pregnancy, unspecified, unspecified trimester: Secondary | ICD-10-CM

## 2012-01-09 ENCOUNTER — Encounter: Payer: Self-pay | Admitting: Family Medicine

## 2012-01-16 ENCOUNTER — Encounter: Payer: Self-pay | Admitting: Family Medicine

## 2012-01-16 NOTE — Progress Notes (Signed)
Note reviewed.  Agree with Dr. Louanne Belton.  Following issues noted: Risk factors for diabetes: +FH DM and h/o macrosomic baby.  Pt left before early glucola could be done.  Will get ASAP. H/o section for FTP.  Will need delivery planning with OBs at 30 weeks. Undecided about genetic testing.  Will review options.   Dating: 20 week ultrasound differs from LMP dating by 1 week, 1 day.  If reliable LMP, will continue to use LMP dating.

## 2012-01-17 ENCOUNTER — Ambulatory Visit (INDEPENDENT_AMBULATORY_CARE_PROVIDER_SITE_OTHER): Payer: Self-pay | Admitting: Family Medicine

## 2012-01-17 VITALS — BP 109/64 | Temp 98.1°F | Wt 139.9 lb

## 2012-01-17 DIAGNOSIS — Z23 Encounter for immunization: Secondary | ICD-10-CM

## 2012-01-17 DIAGNOSIS — Z3490 Encounter for supervision of normal pregnancy, unspecified, unspecified trimester: Secondary | ICD-10-CM

## 2012-01-17 LAB — GLUCOSE, CAPILLARY: Comment 1: 1

## 2012-01-17 NOTE — Progress Notes (Unsigned)
31 yo G2P1 here for follow up visit.  No complaints or questions at this time.  See flow sheet for details.  Fundus at umblicus. Dating confirmed - sure LMP. Declined genetics screening. H/O section in 2010 - will need appt with OB to discuss delivery planning at 30 weeks. + H/O macrosomic baby and first degree relative with diabetes - will do 1 hour glucola today. Flu shot today.

## 2012-01-17 NOTE — Patient Instructions (Signed)
It was nice to meet you today. We will give you a flu shot today to protect you and your baby.  We will check the one hour diabetes test today.  If it is normal, we will need to repeat in about 8 weeks. Try to walk for exercise and cut down on sugary foods. Everything looks great.  We will make another appointment with Dr. Louanne Belton in 4 weeks.  Fue Optometrist. Le daremos una vacuna contra la gripe hoy para proteger a usted ya su beb. Comprobaremos la prueba de la diabetes una hora hoy. Si es normal, tendremos que repetir en unas 8 semanas. Trate de caminar para hacer ejercicio y reducir el consumo de alimentos azucarados. Todo se ve [employer]. Vamos a hacer otra cita con el doctor Ritch en 4 semanas.

## 2012-01-23 ENCOUNTER — Other Ambulatory Visit: Payer: Self-pay

## 2012-01-23 DIAGNOSIS — Z3490 Encounter for supervision of normal pregnancy, unspecified, unspecified trimester: Secondary | ICD-10-CM

## 2012-01-23 NOTE — Progress Notes (Signed)
3 HR GTT DONE TODAY [name] 

## 2012-01-24 LAB — GLUCOSE TOLERANCE, 3 HOURS
Glucose Tolerance, 1 hour: 153 mg/dL (ref 70–189)
Glucose Tolerance, 2 hour: 174 mg/dL — ABNORMAL HIGH (ref 70–164)
Glucose, GTT - 3 Hour: 145 mg/dL — ABNORMAL HIGH (ref 70–144)

## 2012-01-25 ENCOUNTER — Telehealth: Payer: Self-pay | Admitting: Family Medicine

## 2012-01-25 ENCOUNTER — Other Ambulatory Visit: Payer: Self-pay | Admitting: Family Medicine

## 2012-01-25 DIAGNOSIS — O24419 Gestational diabetes mellitus in pregnancy, unspecified control: Secondary | ICD-10-CM

## 2012-01-25 DIAGNOSIS — O99810 Abnormal glucose complicating pregnancy: Secondary | ICD-10-CM | POA: Insufficient documentation

## 2012-01-25 NOTE — Telephone Encounter (Signed)
Specifically the high risk OB clinic at [facility].

## 2012-01-25 NOTE — Progress Notes (Signed)
GTT failed.  Will refer to [facility]OB

## 2012-01-25 NOTE — Telephone Encounter (Signed)
Miranes,  Can you please call and schedule an appointment for patient in OB clinic.  Thank you,Ayriel Texidor

## 2012-01-25 NOTE — Telephone Encounter (Signed)
It would appear that the patient is developing pregnancy-related diabetes so she needs to be seen by our OB-GYNs so we make sure everything goes well with her pregnancy.  Could you please call her and inform her of this?

## 2012-01-29 NOTE — Telephone Encounter (Signed)
Her appointment has been made on 02/04/2012 at 7:30am.  Could you please inform her of this?  Thanks!

## 2012-01-30 NOTE — Telephone Encounter (Signed)
Pt is aware about her appt @ [facility]. [employer]

## 2012-02-04 ENCOUNTER — Encounter: Payer: Self-pay | Attending: Family Medicine | Admitting: Dietician

## 2012-02-04 ENCOUNTER — Ambulatory Visit (INDEPENDENT_AMBULATORY_CARE_PROVIDER_SITE_OTHER): Payer: No Typology Code available for payment source | Admitting: Obstetrics & Gynecology

## 2012-02-04 VITALS — Ht 61.5 in | Wt 141.7 lb

## 2012-02-04 DIAGNOSIS — Z713 Dietary counseling and surveillance: Secondary | ICD-10-CM | POA: Insufficient documentation

## 2012-02-04 DIAGNOSIS — O99810 Abnormal glucose complicating pregnancy: Secondary | ICD-10-CM | POA: Insufficient documentation

## 2012-02-04 NOTE — Progress Notes (Signed)
Nutrition Note: (1st visit- RD/CDR only) Pt seen for GDM diet education. Pt has gained 16.7# @ [redacted]w[redacted]d, which is wnl. Pt reports eating 3 meals & 2 snacks/d. Pt drinks water & soy milk. Pt is taking PNV. Pt reports no N&V. Pt given verbal & written GDM education. Disc importance of BF. Disc wt gain goals of 25-35# total. Pt agrees to follow GDM diet including 3 meals & 3 snacks with proper CHO/ protein combination. Pt does not receive WIC but plans to apply. Pt does plan to BF. F/u in 2-4 wks Blondell Reveal, MS, RD, LDN

## 2012-02-04 NOTE — Progress Notes (Signed)
Diabetes Education:  Seen today for new onset GDM.  No GDM with the first pregnancy.  Completed diet review for the carb restricted diet for GDM.  Assisted by Dorie the Spanish interpreter.  Provided handouts in Spanish Nutrition, Diabetes and Pregnancy, along with the Carbohydrate Counting booklet in Spanish. Provided a True Track meter OZH:[id number] Exp: 2013/12/04  Box or lancets: Lot [id number] Exp: 2015/10/08  And 1 box strips Lot: [id number] EXp: 2014/04/03.  Completed a return demonstration for the BGM.  Glucose approx. 2 hr post-meal was 86 mg.  Instructed to bring glucose log and meter to each clinic appointment.  [name] Demetric Dunnaway, RN, RD, CDE

## 2012-02-11 ENCOUNTER — Ambulatory Visit (INDEPENDENT_AMBULATORY_CARE_PROVIDER_SITE_OTHER): Payer: Self-pay | Admitting: Family Medicine

## 2012-02-11 ENCOUNTER — Encounter: Payer: Self-pay | Admitting: Family Medicine

## 2012-02-11 VITALS — BP 104/70 | Temp 97.8°F | Wt 141.2 lb

## 2012-02-11 DIAGNOSIS — O99810 Abnormal glucose complicating pregnancy: Secondary | ICD-10-CM

## 2012-02-11 DIAGNOSIS — O24419 Gestational diabetes mellitus in pregnancy, unspecified control: Secondary | ICD-10-CM

## 2012-02-11 DIAGNOSIS — Z3490 Encounter for supervision of normal pregnancy, unspecified, unspecified trimester: Secondary | ICD-10-CM

## 2012-02-11 LAB — POCT URINALYSIS DIP (DEVICE)
Bilirubin Urine: NEGATIVE
Glucose, UA: 100 mg/dL — AB
Nitrite: NEGATIVE
Urobilinogen, UA: 0.2 mg/dL (ref 0.0–1.0)

## 2012-02-11 MED ORDER — TETANUS-DIPHTH-ACELL PERTUSSIS 5-2.5-18.5 LF-MCG/0.5 IM SUSP
0.5000 mL | Freq: Once | INTRAMUSCULAR | Status: AC
  Administered 2012-02-11: 0.5 mL via INTRAMUSCULAR

## 2012-02-11 NOTE — Progress Notes (Signed)
  Subjective:    [name] is being seen today for her first obstetrical visit at [facility]. She has been seen at [facility] but was transferred for gestational diabetes. She did have a macrosomic baby that was delivered by c-section for failure to progress (thinks she reached 7 cm dilation).  This is a planned pregnancy. She is at [redacted]w[redacted]d gestation. Her obstetrical history is significant for macrosomia. Relationship with FOB: spouse, living together. Patient does intend to breast feed. Pregnancy history fully reviewed.  Patient reports no bleeding, no contractions, no cramping and no leaking.  Review of Systems:   Review of Systems No fever/chills, malaise. No headache, vision changes. No nausea, vomiting, diarrhea, constipation, dysuria, vaginal discharge.  Objective:     BP 104/70  Temp 97.8 F (36.6 C)  Wt 141 lb 3.2 oz (64.048 kg) Physical Exam  Exam GEN:  Well nourished, well developed, no distress HEENT:  NCAT Eyes:  Conjunctiva normal/clear, EOMI CV:  RRR RESP:  No resp distress ABD:  Gravid, non-tender EXTREM:  No edema, non-tender, full ROM NEURO:  Alert and oriented.   Reviewed blood sugars: Fasting all less than 95 with 2 exceptions (120-130s). PP 5/20 are over 120 but only one or two are excessive and patient aware of eating high-sugar beverage. Reviewed blood sugar goals and dietary recommendations.  Reviewed fetal survey - normal anatomy scan at 20 weeks.  Assessment:    Pregnancy: G2P1001 Patient Active Problem List  Diagnosis  . HEMORRHOIDS  . GASTROESOPHAGEAL REFLUX DISEASE  . Pregnancy  . DM (diabetes mellitus), gestational       Plan:     Initial labs drawn. Prenatal vitamins. Problem list reviewed and updated. AFP3 discussed: declined. Role of ultrasound in pregnancy discussed; fetal survey: results reviewed. Amniocentesis discussed: not indicated. Continue diet control of diabetes. Pt instructed to call if sugars are repeatedly  above goal. Follow up in 2 weeks. 50% of 20 min visit spent on counseling and coordination of care.    Napoleon Form 02/11/2012

## 2012-02-11 NOTE — Patient Instructions (Signed)
Call if fasting blood sugars are running over 95 or if post-meal sugars are running over 120.  Diabetes mellitus gestacional (Gestational Diabetes Mellitus) La diabetes mellitus gestacional se produce slo durante el embarazo. Aparece cuando el organismo no puede controlar adecuadamente la glucosa (azcar) que aumenta en la sangre despus de comer. Durante el [place], se produce una resistencia a la insulina (sensibilidad reducida a la insulina) debido a la liberacin de hormonas por parte de la placenta. Generalmente, el pncreas de una mujer embarazada produce la cantidad suficiente de insulina para vencer esa resistencia. Sin embargo, en la diabetes gestacional, hay insulina pero no cumple su funcin adecuadamente. Si la resistencia es lo suficientemente grave como para que el pncreas no produzca la cantidad de insulina suficiente, la glucosa extra se acumula en la sangre.  [name] RIESGO DE DESARROLLAR DIABETES GESTACIONAL?  Las mujeres con historia de diabetes en la familia.  Las mujeres de ms de [address]  Las que presentan sobrepeso.  Las [employer] pertenecen a ciertos grupos tnicos (latinas, afroamericanas, norteamericanas nativas, asiticas y las originarias de las islas del Pacfico. QUE PUEDE OCURRIRLE AL BEB? Si el nivel de glucosa en sangre de la madre es demasiado elevado mientras este [place], el nivel extra de azcar pasar por el cordn umbilical hacia el beb. Algunos de los problemas del beb pueden ser:  Beb demasiado grande: si el nio recibe Chief Strategy Officer, puede aumentar mucho de [place]. Esto puede hacer que sea demasiado grande para nacer por parto normal (vaginal) por lo que ser necesario realizar una cesrea.  Bajo nivel de glucosa (hipoglucemia): el beb produce insulina extra en respuesta a la excesiva cantidad de azcar que obtiene de [employer]. Cuando el beb nace y ya no necesita insulina extra, su nivel de azcar en sangre puede  disminuir.  Ictericia (coloracin amarillenta de la piel y los ojos): esto es bastante frecuente en los bebs. La causa es la acumulacin de una sustancia qumica denominada bilirrubina. No siempre es un trastorno grave, pero se observa con frecuencia en los bebs cuyas madres sufren diabetes gestacional. RIESGOS PARA LA MADRE Las mujeres que han sufrido diabetes gestacional pueden tener ms riesgos para algunos problemas como:  Preeclampsia o toxemia, incluyendo problemas con hipertensin arterial. La presin arterial y los niveles de protenas en la orina deben controlarse con frecuencia.  Infecciones  Parto por cesrea.  Aparicin de diabetes tipo 2 en una etapa posterior de la vida. Alrededor del 30% al 50% sufrir diabetes posteriormente, especialmente las que son obesas. DIAGNSTICO Las hormonas que causan resistencia a la insulina tienen su mayor nivel alrededor de las 24 a 28 semanas del Psychiatrist. Si se experimentan sntomas, stos son similares a los sntomas que normalmente aparecen durante el embarazo.  La diabetes mellitus gestacional generalmente se diagnostica por medio de un mtodo en dos partes: 1. Despus de la 24 a 28 semanas de Psychiatrist, la mujer debe beber una solucin que contiene glucosa y Education officer, environmental un anlisis de [place]. Si el nivel de glucosa es elevado, la realizarn un segundo [place]. 2. La prueba oral de tolerancia a la glucosa, que dura aproximadamente tres horas. Despus de realizar ayuno durante la noche, se controla nivel de glucosa en sangre. La mujer bebe una solucin que contiene glucosa y Chief Executive Officer realizan anlisis de glucosa en sangre cada hora. Si la mujer tiene factores de riesgos para la diabetes mellitus gestacional, el mdico podr Programme researcher, broadcasting/film/video anlisis antes de las 24 semanas de [place]. TRATAMIENTO El tratamiento est dirigido  a Horticulturist, commercial de la madre en un nivel normal y puede incluir:  La planificacin de los alimentos.  Recibir  insulina u otro medicamento para Sales executive nivel de glucosa en [place].  La prctica de ejercicios.  Llevar un registro diario de los alimentos que consume.  Control y Engineer, maintenance (IT) de los niveles de glucosa en [place].  Control de los niveles de cetona en la [place], [place] esto ya no se considera necesario en la mayora de los [place]. INSTRUCCIONES PARA EL CUIDADO DOMICILIARIO Mientras est embarazada:  Siga los consejos de su mdico relacionados con los controles prenatales, la planificacin de la comida, la actividad fsica, los [place], vitaminas, los anlisis de sangre y otras pruebas y las actividades fsicas.  Lleve un registro de las comidas, las pruebas de glucosa en sangre y la cantidad de insulina que recibe (si corresponde). Muestre todo al profesional en cada consulta mdica prenatal.  Si sufre diabetes mellitus gestacional, podr tener problemas de hipoglucemia (nivel bajo de glucosa en sangre). Podr sospechar este problema si se siente repentinamente mareada, tiene temblores y/o se siente dbil. Si cree que esto le est ocurriendo, y tiene un medidor de glucosa, mida su nivel de Event organiser. Siga los consejos de su mdico sobre el modo y el momento de tratar su nivel de glucosa en sangre. Generalmente se sigue la regla 15:15 Consuma 15 g de hidratos de carbono, espere 15 minutos y Programmer, systems el nivel de glucosa en [place].[name] de 15 g de hidratos de carbono son:  1 taza de [employer].   taza de jugo.  3-4 tabletas de glucosa.  5-6 caramelos duros.  1 caja pequea de pasas de uva.   taza de gaseosa comn.  Mantenga una buena higiene para evitar infecciones.  No fume. SOLICITE ATENCIN MDICA SI:  Observa prdida vaginal con o sin picazn.  Se siente ms dbil o cansada que lo habitual.  [place].  Tiene un aumento de peso repentino, 2,5 kg o ms en una semana.  Pierde peso, 1.5 kg o ms en una semana.  Su nivel de glucosa  en sangre es elevado, necesita instrucciones. SOLICITE ATENCIN MDICA DE INMEDIATO SI:  Sufre una cefalea intensa.  Se marea o pierde el conocimiento  Presenta nuseas o vmitos.  Se siente desorientada confundida.  Sufre convulsiones.  Tiene problemas de visin.  Siente Physiological scientist.  Presenta una hemorragia vaginal abundante.  Tiene contracciones uterinas.  Tiene una prdida importante de lquido por la vagina DESPUS QUE NACE EL BEB:  Concurra a todos los controles de seguimiento y Clinical biochemist los anlisis de sangre segn las indicaciones de su mdico.  Mantenga un estilo de vida saludable para evitar la diabetes en el futuro. Aqu se incluye:  Siga el plan de alimentacin saludable.  Controle su peso.  Practique actividad fsica y descanse lo necesario.  No fume.  Amamante a su beb mientras pueda. Esto disminuir la probabilidad de que usted y su beb sufran diabetes posteriormente. Para ms informacin acerca de la diabetes, visite la pgina web de Holiday representative Diabetes Association: PMFashions.com.cy. Para ms informacin acerca de la diabetes gestacional cite la pgina web del [employer] en: RentRule.com.au. Document Released: 01/31/2005 Document Revised: 07/16/2011 [facility] Patient Information 2013 [place], [place].

## 2012-02-11 NOTE — Progress Notes (Signed)
Pulse- 80 Weight gain 25-35lbs Given new ob packet.  Consented to receive the tdap.

## 2012-02-18 ENCOUNTER — Encounter: Payer: Self-pay | Admitting: Family Medicine

## 2012-02-25 ENCOUNTER — Ambulatory Visit (INDEPENDENT_AMBULATORY_CARE_PROVIDER_SITE_OTHER): Payer: Self-pay | Admitting: Obstetrics & Gynecology

## 2012-02-25 VITALS — BP 103/62 | Temp 97.8°F | Wt 142.5 lb

## 2012-02-25 DIAGNOSIS — Z98891 History of uterine scar from previous surgery: Secondary | ICD-10-CM | POA: Insufficient documentation

## 2012-02-25 DIAGNOSIS — O34219 Maternal care for unspecified type scar from previous cesarean delivery: Secondary | ICD-10-CM

## 2012-02-25 DIAGNOSIS — O99810 Abnormal glucose complicating pregnancy: Secondary | ICD-10-CM

## 2012-02-25 DIAGNOSIS — O24419 Gestational diabetes mellitus in pregnancy, unspecified control: Secondary | ICD-10-CM

## 2012-02-25 LAB — POCT URINALYSIS DIP (DEVICE)
Bilirubin Urine: NEGATIVE
Glucose, UA: 100 mg/dL — AB
Ketones, ur: NEGATIVE mg/dL
Nitrite: NEGATIVE

## 2012-02-25 NOTE — Progress Notes (Signed)
FBS <96 except 2 values 102,103.  Postprandials only few values >120-130.  No problem with diet. Discussed TOLAC vs CS and risk of uterine rupture, emergency delivery, risk of cesarean section. Will review TOLAC consent today and sign for her preference.

## 2012-02-25 NOTE — Patient Instructions (Signed)
Parto vaginal luego de una cesrea (Vaginal Birth After Cesarean Delivery) Un parto vaginal luego de un parto por cesrea es dar a luz por la vagina luego de haber dado a luz por medio de una intervencin quirrgica. En el pasado, si una mujer tena un beb por cesrea, todos los partos posteriores deban hacerse por cesrea. Esto ya no es as. Puede ser seguro para la mam intentar un parto vaginal luego de una cesrea. La decisin final de tener un parto vaginal o por cesrea debe tomarse en conjunto, entre la paciente y el mdico. Los riesgos y los beneficios debern evaluarse con relacin a los motivos y al tipo de cesrea previa LAS MUJERES QUE QUIEREN TENER UN PARTO VAGINAL, DEBEN CONSULTAR CON SU MDICO PARA ASEGURARSE QUE:  La cesrea anterior se realiz con una incisin uterina transversal (no con una incisin vertical clsica).   El canal de parto es lo suficientemente grande como para que pase el nio.   No ha sido sometida a otras operaciones del tero.   Durante el trabajo de parto le realizarn un monitoreo electrnico fetal, en todo momento.   Es necesario que haya un quirfano disponible y listo en caso de necesitar una cesrea de emergencia.   Un cirujano y personal de quirfano estarn disponibles en todo momento durante el trabajo de parto, para realizar una cesrea en caso de ser necesario.   Habr un anestesista disponible en caso de necesitar una cesrea de emergencia.   La nursery est lista cuenta con personal especializado y el equipo disponible para cuidar al beb en caso de emergencia.  BENEFICIOS  Permanencia ms breve en el hospital.   Menores costos en el parto, la nurse y el hospital.   Menos prdida de sangre y menos probabilidad de necesitar una transfusin sangunea.   Menos probabilidad de tener fiebre o molestias como consecuencia de una ciruga mayor   Menos riesgo de cogulos sanguneos.   Menos riesgo de sufrir infecciones.   Recuperacin ms  rpida luego del alta mdica.   Menos riesgo de complicaciones quirrgicas, como apertura o hernia de la incisin.   Disminucin del riesgo de lesiones a otros rganos   Menor riesgo de remocin del tero (histerectoma)   Menor riesgo de que la placenta cubra parcial o completamente la abertura del tero (placenta previa) en embarazos futuros   Posibilidad de tener una familia grande, si lo desea.  RIESGOS  Ruptura del tero.   Si el tero se rompe le realizarn una histerectoma.   Todas las complicaciones de una ciruga mayor y lesiones en otros rganos.   Hemorragia excesiva, cogulos e infeccin.   [place] Puntuacin Apgar baja (mtodo que evala al recin nacido segn su apariencia, pulso, muecas, actividad y respiracion) y ms riesgos para el beb.   Hay mas riesgo de ruptura del tero si se induce o aumenta el trabajo de parto.   Hay un mayor riesgo de ruptura uterina si se usan medicamentos para madurar el cuello.  NO DEBE LLEVARSE A CABO SI:  La cesrea previa se realiz con una incicin vertical (clsica) o con forma de T, o usted no sabe cul de ellas le han practicado.   Ha sufrido ruptura del tero.   Le han practicado una ciruga de tero.   Tiene problemas mdicos u obsttricos.   El beb est en problemas.   Tuvo dos cesreas previas y ningn parto vaginal.  OTRAS COSAS QUE DEBE SABER:  La anestesia peridural es segura.     Es seguro dar vuelta al beb si se encuentra de nalgas (intentar una versin ceflica externa).   Es seguro intentarlo en caso de mellizos.   Los embarazos de ms de 40 semanas no tienen xito con este procedimiento.   Hay un aumento de fracasos en embarazadas obesas.   Hay un aumento del porcentaje de fracasos si el beb pesa 4 Kg o ms.   Hay aumento en el porcentaje de fracasos si el intervalo entre la operacin cesrea y el parto vaginal es de menos de 19 meses.   Hay un aumento en el porcentaje de fracasos si ha  sufrido preeclampsia hipertensin arterial, protenas en la orina e hinchazn del rostro y las extremidades.   El parto vaginal ser muy exitoso si tuvo un parto vaginal previo.   Tambin es exitoso en el caso que el trabajo de parto comience espontneamente antes de la fecha.   El parto vaginal luego de una cesrea es similar a un parto espontneo vaginal normal.  Es importante que converse con su mdico desde comienzos del embarazo de modo que pueda comprender los riesgos, beneficios y opciones. De este modo tendr tiempo de decidir que es lo mejor en su caso particular en relacin a su parto por cesrea anterior. Hay que tener en cuenta que puede haber cambios en la madre durante el embarazo, lo que hace necesario cambiar su decisin o la del mdico. Los consejos, preocupaciones y decisiones debern documentarse en la historia clnica y debe ser firmada por todas las partes. Document Released: 10/10/2007 Document Revised: 04/12/2011 ExitCare Patient Information 2012 [employer]. 

## 2012-02-25 NOTE — Progress Notes (Signed)
Pulse 80 Needs  CBC, RPR

## 2012-03-10 ENCOUNTER — Ambulatory Visit (INDEPENDENT_AMBULATORY_CARE_PROVIDER_SITE_OTHER): Payer: Self-pay | Admitting: Obstetrics & Gynecology

## 2012-03-10 ENCOUNTER — Encounter: Payer: Self-pay | Attending: Family Medicine | Admitting: Dietician

## 2012-03-10 VITALS — BP 104/67 | Temp 97.4°F | Wt 142.4 lb

## 2012-03-10 DIAGNOSIS — O99810 Abnormal glucose complicating pregnancy: Secondary | ICD-10-CM

## 2012-03-10 DIAGNOSIS — O34219 Maternal care for unspecified type scar from previous cesarean delivery: Secondary | ICD-10-CM

## 2012-03-10 DIAGNOSIS — O24419 Gestational diabetes mellitus in pregnancy, unspecified control: Secondary | ICD-10-CM

## 2012-03-10 DIAGNOSIS — Z713 Dietary counseling and surveillance: Secondary | ICD-10-CM | POA: Insufficient documentation

## 2012-03-10 DIAGNOSIS — Z98891 History of uterine scar from previous surgery: Secondary | ICD-10-CM

## 2012-03-10 LAB — POCT URINALYSIS DIP (DEVICE)
Glucose, UA: 500 mg/dL — AB
Ketones, ur: NEGATIVE mg/dL
Specific Gravity, Urine: 1.02 (ref 1.005–1.030)
Urobilinogen, UA: 0.2 mg/dL (ref 0.0–1.0)

## 2012-03-10 NOTE — Patient Instructions (Signed)
Diabetes mellitus gestacional (Gestational Diabetes Mellitus) La diabetes mellitus gestacional se produce slo durante el embarazo. Aparece cuando el organismo no puede controlar adecuadamente la glucosa (azcar) que aumenta en la sangre despus de comer. Durante el embarazo, se produce una resistencia a la insulina (sensibilidad reducida a la insulina) debido a la liberacin de hormonas por parte de la placenta. Generalmente, el pncreas de una mujer embarazada produce la cantidad suficiente de insulina para vencer esa resistencia. Sin embargo, en la diabetes gestacional, hay insulina pero no cumple su funcin adecuadamente. Si la resistencia es lo suficientemente grave como para que el pncreas no produzca la cantidad de insulina suficiente, la glucosa extra se acumula en la sangre.  QUINES TIENEN RIESGO DE DESARROLLAR DIABETES GESTACIONAL?  Las mujeres con historia de diabetes en la familia.  Las mujeres de ms de 25 aos.  Las que presentan sobrepeso.  Las mujeres que pertenecen a ciertos grupos tnicos (latinas, afroamericanas, norteamericanas nativas, asiticas y las originarias de las islas del Pacfico. QUE PUEDE OCURRIRLE AL BEB? Si el nivel de glucosa en sangre de la madre es demasiado elevado mientras este embarazada, el nivel extra de azcar pasar por el cordn umbilical hacia el beb. Algunos de los problemas del beb pueden ser:  Beb demasiado grande: si el nio recibe demasiada azcar, puede aumentar mucho de peso. Esto puede hacer que sea demasiado grande para nacer por parto normal (vaginal) por lo que ser necesario realizar una cesrea.  Bajo nivel de glucosa (hipoglucemia): el beb produce insulina extra en respuesta a la excesiva cantidad de azcar que obtiene de la madre. Cuando el beb nace y ya no necesita insulina extra, su nivel de azcar en sangre puede disminuir.  Ictericia (coloracin amarillenta de la piel y los ojos): esto es bastante frecuente en los bebs. La  causa es la acumulacin de una sustancia qumica denominada bilirrubina. No siempre es un trastorno grave, pero se observa con frecuencia en los bebs cuyas madres sufren diabetes gestacional. RIESGOS PARA LA MADRE Las mujeres que han sufrido diabetes gestacional pueden tener ms riesgos para algunos problemas como:  Preeclampsia o toxemia, incluyendo problemas con hipertensin arterial. La presin arterial y los niveles de protenas en la orina deben controlarse con frecuencia.  Infecciones  Parto por cesrea.  Aparicin de diabetes tipo 2 en una etapa posterior de la vida. Alrededor del 30% al 50% sufrir diabetes posteriormente, especialmente las que son obesas. DIAGNSTICO Las hormonas que causan resistencia a la insulina tienen su mayor nivel alrededor de las 24 a 28 semanas del embarazo. Si se experimentan sntomas, stos son similares a los sntomas que normalmente aparecen durante el embarazo.  La diabetes mellitus gestacional generalmente se diagnostica por medio de un mtodo en dos partes: 1. Despus de la 24 a 28 semanas de embarazo, la mujer debe beber una solucin que contiene glucosa y realizar un anlisis de sangre. Si el nivel de glucosa es elevado, la realizarn un segundo anlisis. 2. La prueba oral de tolerancia a la glucosa, que dura aproximadamente tres horas. Despus de realizar ayuno durante la noche, se controla nivel de glucosa en sangre. La mujer bebe una solucin que contiene glucosa y le realizan anlisis de glucosa en sangre cada hora. Si la mujer tiene factores de riesgos para la diabetes mellitus gestacional, el mdico podr indicar el anlisis antes de las 24 semanas de embarazo. TRATAMIENTO El tratamiento est dirigido a mantener la glucosa en sangre de la madre en un nivel normal y puede incluir:  La   planificacin de los alimentos.  Recibir insulina u otro medicamento para controlar el nivel de glucosa en sangre.  La prctica de ejercicios.  Llevar un  registro diario de los alimentos que consume.  Control y registro de los niveles de glucosa en sangre.  Control de los niveles de cetona en la orina, aunque esto ya no se considera necesario en la mayora de los embarazos. INSTRUCCIONES PARA EL CUIDADO DOMICILIARIO Mientras est embarazada:  Siga los consejos de su mdico relacionados con los controles prenatales, la planificacin de la comida, la actividad fsica, los medicamentos, vitaminas, los anlisis de sangre y otras pruebas y las actividades fsicas.  Lleve un registro de las comidas, las pruebas de glucosa en sangre y la cantidad de insulina que recibe (si corresponde). Muestre todo al profesional en cada consulta mdica prenatal.  Si sufre diabetes mellitus gestacional, podr tener problemas de hipoglucemia (nivel bajo de glucosa en sangre). Podr sospechar este problema si se siente repentinamente mareada, tiene temblores y/o se siente dbil. Si cree que esto le est ocurriendo, y tiene un medidor de glucosa, mida su nivel de glucosa en sangre. Siga los consejos de su mdico sobre el modo y el momento de tratar su nivel de glucosa en sangre. Generalmente se sigue la regla 15:15 Consuma 15 g de hidratos de carbono, espere 15 minutos y vuelva controlar el nivel de glucosa en sangre.. Ejemplos de 15 g de hidratos de carbono son:  1 taza de leche descremada.   taza de jugo.  3-4 tabletas de glucosa.  5-6 caramelos duros.  1 caja pequea de pasas de uva.   taza de gaseosa comn.  Mantenga una buena higiene para evitar infecciones.  No fume. SOLICITE ATENCIN MDICA SI:  Observa prdida vaginal con o sin picazn.  Se siente ms dbil o cansada que lo habitual.  Transpira mucho.  Tiene un aumento de peso repentino, 2,5 kg o ms en una semana.  Pierde peso, 1.5 kg o ms en una semana.  Su nivel de glucosa en sangre es elevado, necesita instrucciones. SOLICITE ATENCIN MDICA DE INMEDIATO SI:  Sufre una cefalea  intensa.  Se marea o pierde el conocimiento  Presenta nuseas o vmitos.  Se siente desorientada confundida.  Sufre convulsiones.  Tiene problemas de visin.  Siente dolor en el estmago.  Presenta una hemorragia vaginal abundante.  Tiene contracciones uterinas.  Tiene una prdida importante de lquido por la vagina DESPUS QUE NACE EL BEB:  Concurra a todos los controles de seguimiento y realice los anlisis de sangre segn las indicaciones de su mdico.  Mantenga un estilo de vida saludable para evitar la diabetes en el futuro. Aqu se incluye:  Siga el plan de alimentacin saludable.  Controle su peso.  Practique actividad fsica y descanse lo necesario.  No fume.  Amamante a su beb mientras pueda. Esto disminuir la probabilidad de que usted y su beb sufran diabetes posteriormente. Para ms informacin acerca de la diabetes, visite la pgina web de la American Diabetes Association: www.americandiabetesassociation.org. Para ms informacin acerca de la diabetes gestacional cite la pgina web del [employer] of Obstetricians and Gynecologists en: www.acog.org. Document Released: 01/31/2005 Document Revised: 07/16/2011 ExitCare Patient Information 2013 [employer].  

## 2012-03-10 NOTE — Progress Notes (Signed)
Pulse- 80 

## 2012-03-10 NOTE — Progress Notes (Signed)
Diabetes Education:  Provided one box True Track strips.  Lot: [id number] Exp: 2013/10/07.  Denies any problems with meter or process.  [name] Tedford Berg, RN, RD, CDE

## 2012-03-10 NOTE — Progress Notes (Signed)
FBS almost all <93, PP 86-119. Plans to attempt VBAC, signed consent.

## 2012-03-18 ENCOUNTER — Telehealth: Payer: Self-pay | Admitting: *Deleted

## 2012-03-18 ENCOUNTER — Encounter (HOSPITAL_COMMUNITY): Payer: Self-pay | Admitting: Family

## 2012-03-18 ENCOUNTER — Inpatient Hospital Stay (HOSPITAL_COMMUNITY)
Admission: AD | Admit: 2012-03-18 | Discharge: 2012-03-18 | Disposition: A | Discharge status: Home / Self Care | Payer: Self-pay | Source: Ambulatory Visit | Attending: Obstetrics & Gynecology | Admitting: Obstetrics & Gynecology

## 2012-03-18 DIAGNOSIS — N760 Acute vaginitis: Secondary | ICD-10-CM | POA: Insufficient documentation

## 2012-03-18 DIAGNOSIS — O26899 Other specified pregnancy related conditions, unspecified trimester: Secondary | ICD-10-CM

## 2012-03-18 DIAGNOSIS — O99810 Abnormal glucose complicating pregnancy: Secondary | ICD-10-CM | POA: Insufficient documentation

## 2012-03-18 DIAGNOSIS — B9689 Other specified bacterial agents as the cause of diseases classified elsewhere: Secondary | ICD-10-CM | POA: Insufficient documentation

## 2012-03-18 DIAGNOSIS — A499 Bacterial infection, unspecified: Secondary | ICD-10-CM | POA: Insufficient documentation

## 2012-03-18 DIAGNOSIS — O24419 Gestational diabetes mellitus in pregnancy, unspecified control: Secondary | ICD-10-CM

## 2012-03-18 DIAGNOSIS — N949 Unspecified condition associated with female genital organs and menstrual cycle: Secondary | ICD-10-CM | POA: Insufficient documentation

## 2012-03-18 DIAGNOSIS — O2390 Unspecified genitourinary tract infection in pregnancy, unspecified trimester: Secondary | ICD-10-CM | POA: Insufficient documentation

## 2012-03-18 LAB — URINE MICROSCOPIC-ADD ON

## 2012-03-18 LAB — URINALYSIS, ROUTINE W REFLEX MICROSCOPIC
Bilirubin Urine: NEGATIVE
Glucose, UA: NEGATIVE mg/dL
Hgb urine dipstick: NEGATIVE
Ketones, ur: NEGATIVE mg/dL
Nitrite: NEGATIVE
Protein, ur: NEGATIVE mg/dL
Specific Gravity, Urine: 1.015 (ref 1.005–1.030)
Urobilinogen, UA: 0.2 mg/dL (ref 0.0–1.0)
pH: 7 (ref 5.0–8.0)

## 2012-03-18 LAB — WET PREP, GENITAL

## 2012-03-18 MED ORDER — METRONIDAZOLE 500 MG PO TABS: 500.0000 mg | ORAL_TABLET | Freq: Two times a day (BID) | ORAL | Status: DC

## 2012-03-18 NOTE — MAU Provider Note (Signed)
History     CSN: [id number]  Arrival date and time: 03/18/12 1538   First Provider Initiated Contact with Patient 03/18/12 1703      No chief complaint on file.  HPI 31 y.o. G2P1001 at [redacted]w[redacted]d with increased vaginal discharge since Friday, some pinkish discharge on Saturday. No pain, + fetal movement, uncomplicated prenatal course except for GDM.     Past Medical History  Diagnosis Date  . No pertinent past medical history   . Gestational diabetes     Past Surgical History  Procedure Date  . Cesarean section     Family History  Problem Relation Age of Onset  . Hypertension Mother   . Diabetes Father     History  Substance Use Topics  . Smoking status: Never Smoker   . Smokeless tobacco: Never Used  . Alcohol Use: No    Allergies: No Known Allergies  Prescriptions prior to admission  Medication Sig Dispense Refill  . Prenatal Vit-Fe Fumarate-FA (PRENATAL MULTIVITAMIN) TABS Take 1 tablet by mouth daily.        Review of Systems  Constitutional: Negative.   Respiratory: Negative.   Cardiovascular: Negative.   Gastrointestinal: Negative for nausea, vomiting, abdominal pain, diarrhea and constipation.  Genitourinary: Negative for dysuria, urgency, frequency, hematuria and flank pain.       Positive for spotting   Musculoskeletal: Negative.   Neurological: Negative.   Psychiatric/Behavioral: Negative.    Physical Exam   Blood pressure 113/64, pulse 76, temperature 97.9 F (36.6 C), temperature source Oral, resp. rate 16, height 5\' 1"  (1.549 m), weight 145 lb 6 oz (65.942 kg).  Physical Exam  Nursing note and vitals reviewed. Constitutional: She is oriented to person, place, and time. She appears well-developed and well-nourished. No distress.  HENT:  Head: Normocephalic and atraumatic.  Cardiovascular: Normal rate.   Respiratory: Effort normal.  GI: Soft. Bowel sounds are normal. She exhibits no mass. There is no tenderness. There is no rebound and no  guarding.  Genitourinary: There is no rash or lesion on the right labia. There is no rash or lesion on the left labia. Uterus is not tender. Enlarged: Size c/w dates. Cervix exhibits friability. Cervix exhibits no motion tenderness and no discharge. Right adnexum displays no mass, no tenderness and no fullness. Left adnexum displays no mass, no tenderness and no fullness. No tenderness or bleeding around the vagina. Vaginal discharge (copius creamy white/brown discharge) found.  Musculoskeletal: Normal range of motion.  Neurological: She is alert and oriented to person, place, and time.  Skin: Skin is warm and dry.  Psychiatric: She has a normal mood and affect.    MAU Course  Procedures  Results for orders placed during the hospital encounter of 03/18/12 (from the past 24 hour(s))  URINALYSIS, ROUTINE W REFLEX MICROSCOPIC     Status: Abnormal   Collection Time   03/18/12  4:11 PM      Component Value Range   Color, Urine YELLOW  YELLOW   APPearance HAZY (*) CLEAR   Specific Gravity, Urine 1.015  1.005 - 1.030   pH 7.0  5.0 - 8.0   Glucose, UA NEGATIVE  NEGATIVE mg/dL   Hgb urine dipstick NEGATIVE  NEGATIVE   Bilirubin Urine NEGATIVE  NEGATIVE   Ketones, ur NEGATIVE  NEGATIVE mg/dL   Protein, ur NEGATIVE  NEGATIVE mg/dL   Urobilinogen, UA 0.2  0.0 - 1.0 mg/dL   Nitrite NEGATIVE  NEGATIVE   Leukocytes, UA MODERATE (*) NEGATIVE  URINE MICROSCOPIC-ADD  ON     Status: Abnormal   Collection Time   03/18/12  4:11 PM      Component Value Range   Squamous Epithelial / LPF MANY (*) RARE   WBC, UA 11-20  <3 WBC/hpf   RBC / HPF 3-6  <3 RBC/hpf   Bacteria, UA MANY (*) RARE   Urine-Other MUCOUS PRESENT    WET PREP, GENITAL     Status: Abnormal   Collection Time   03/18/12  5:10 PM      Component Value Range   Yeast Wet Prep HPF POC NONE SEEN  NONE SEEN   Trich, Wet Prep NONE SEEN  NONE SEEN   Clue Cells Wet Prep HPF POC MODERATE (*) NONE SEEN   WBC, Wet Prep HPF POC FEW (*) NONE SEEN     FERN negative  Assessment and Plan   1. DM (diabetes mellitus), gestational   2. BV (bacterial vaginosis)   3. Vaginal discharge in pregnancy       Medication List     As of 03/18/2012  6:54 PM    START taking these medications         metroNIDAZOLE 500 MG tablet   Commonly known as: FLAGYL   Take 1 tablet (500 mg total) by mouth 2 (two) times daily.      CONTINUE taking these medications         prenatal multivitamin Tabs          Where to get your medications    These are the prescriptions that you need to pick up. We sent them to a specific pharmacy, so you will need to go there to get them.   [facility] - [place], [place] - [address] AT [facility] OF [address]    [address] [place] [address]    Phone: [phone]        metroNIDAZOLE 500 MG tablet            Follow-up Information    Follow up with [facility] (as scheduled)    Contact information:   [address] [place] [address] [phone]          Kyleah Pensabene 03/18/2012, 5:21 PM

## 2012-03-18 NOTE — Telephone Encounter (Signed)
Pt arrived to clinic lobby with a concern. Hospital interpreter-Teresa used for translation. Pt states that since Saturday she has been having some light pink spotting. She has also been having some leakage of clear fluid since Sunday which started as mucous and now is more thin like water. Because the clinic is closed at this time, I advised pt to go to MAU for evaluation.  Pt was escorted to MAU by Rosey Bath- interpreter.

## 2012-03-18 NOTE — MAU Provider Note (Signed)
Attestation of Attending Supervision of Advanced Practitioner (CNM/NP): Evaluation and management procedures were performed by the Advanced Practitioner under my supervision and collaboration.  I have reviewed the Advanced Practitioner's note and chart, and I agree with the management and plan.  Ivalene Platte, MD, FACOG Attending Obstetrician & Gynecologist Faculty Practice, [facility]  

## 2012-03-18 NOTE — MAU Note (Signed)
Pt reports trickling clear fluid since Sunday. Saturday mucous pink-tinged discharge.

## 2012-03-18 NOTE — MAU Note (Signed)
Pt states via Rosey Bath, spanish interpreter that she began leaking fluid Sunday, then noted blood. Very little fluid noted. Denies contractions.

## 2012-03-20 LAB — URINE CULTURE
Colony Count: NO GROWTH
Culture: NO GROWTH

## 2012-03-24 ENCOUNTER — Ambulatory Visit (INDEPENDENT_AMBULATORY_CARE_PROVIDER_SITE_OTHER): Payer: Self-pay | Admitting: Family Medicine

## 2012-03-24 VITALS — BP 100/66 | Temp 97.5°F | Wt 141.3 lb

## 2012-03-24 DIAGNOSIS — O24419 Gestational diabetes mellitus in pregnancy, unspecified control: Secondary | ICD-10-CM

## 2012-03-24 DIAGNOSIS — O99810 Abnormal glucose complicating pregnancy: Secondary | ICD-10-CM

## 2012-03-24 LAB — POCT URINALYSIS DIP (DEVICE)
Glucose, UA: NEGATIVE mg/dL
Hgb urine dipstick: NEGATIVE
Specific Gravity, Urine: 1.025 (ref 1.005–1.030)

## 2012-03-24 NOTE — Progress Notes (Signed)
FBS 77-149 2 hr pp 71-141 Will add protein to bedtime snack and add exercise.  If still up next week, will need addition of medication

## 2012-03-24 NOTE — Patient Instructions (Signed)
Diabetes mellitus gestacional (Gestational Diabetes Mellitus) La diabetes mellitus gestacional se produce slo durante el embarazo. Aparece cuando el organismo no puede controlar adecuadamente la glucosa (azcar) que aumenta en la sangre despus de comer. Durante el embarazo, se produce una resistencia a la insulina (sensibilidad reducida a la insulina) debido a la liberacin de hormonas por parte de la placenta. Generalmente, el pncreas de una mujer embarazada produce la cantidad suficiente de insulina para vencer esa resistencia. Sin embargo, en la diabetes gestacional, hay insulina pero no cumple su funcin adecuadamente. Si la resistencia es lo suficientemente grave como para que el pncreas no produzca la cantidad de insulina suficiente, la glucosa extra se acumula en la sangre.  QUINES TIENEN RIESGO DE DESARROLLAR DIABETES GESTACIONAL?  Las mujeres con historia de diabetes en la familia.  Las mujeres de ms de 25 aos.  Las que presentan sobrepeso.  Las mujeres que pertenecen a ciertos grupos tnicos (latinas, afroamericanas, norteamericanas nativas, asiticas y las originarias de las islas del Pacf[employer]. QUE PUEDE OCURRIRLE AL BEB? Si el nivel de glucosa en sangre de la madre es demasiado elevado mientras este embarazada, el nivel extra de azcar pasar por el cordn umbilical hacia el beb. Algunos de los problemas del beb pueden ser:  Beb demasiado grande: si el nio recibe demasiada azcar, puede aumentar mucho de peso. Esto puede hacer que sea demasiado grande para nacer por parto normal (vaginal) por lo que ser necesario realizar una cesrea.  Bajo nivel de glucosa (hipoglucemia): el beb produce insulina extra en respuesta a la excesiva cantidad de azcar que obtiene de la madre. Cuando el beb nace y ya no necesita insulina extra, su nivel de azcar en sangre puede disminuir.  Ictericia (coloracin amarillenta de la piel y los ojos): esto es bastante frecuente en los bebs.  La causa es la acumulacin de una sustancia qumica denominada bilirrubina. No siempre es un trastorno grave, pero se observa con frecuencia en los bebs cuyas madres sufren diabetes gestacional. RIESGOS PARA LA MADRE Las mujeres que han sufrido diabetes gestacional pueden tener ms riesgos para algunos problemas como:  Preeclampsia o toxemia, incluyendo problemas con hipertensin arterial. La presin arterial y los niveles de protenas en la orina deben controlarse con frecuencia.  Infecciones  Parto por cesrea.  Aparicin de diabetes tipo 2 en una etapa posterior de la vida. Alrededor del 30% al 50% sufrir diabetes posteriormente, especialmente las que son obesas. DIAGNSTICO Las hormonas que causan resistencia a la insulina tienen su mayor nivel alrededor de las 24 a 28 semanas del embarazo. Si se experimentan sntomas, stos son similares a los sntomas que normalmente aparecen durante el embarazo.  La diabetes mellitus gestacional generalmente se diagnostica por medio de un mtodo en dos partes: 1. Despus de la 24 a 28 semanas de embarazo, la mujer debe beber una solucin que contiene glucosa y realizar un anlisis de sangre. Si el nivel de glucosa es elevado, la realizarn un segundo anlisis. 2. La prueba oral de tolerancia a la glucosa, que dura aproximadamente tres horas. Despus de realizar ayuno durante la noche, se controla nivel de glucosa en sangre. La mujer bebe una solucin que contiene glucosa y le realizan anlisis de glucosa en sangre cada hora. Si la mujer tiene factores de riesgos para la diabetes mellitus gestacional, el mdico podr indicar el anlisis antes de las 24 semanas de embarazo. TRATAMIENTO El tratamiento est dirigido a mantener la glucosa en sangre de la madre en un nivel normal y puede incluir:  La   planificacin de los alimentos.  Recibir insulina u otro medicamento para controlar el nivel de glucosa en sangre.  La prctica de ejercicios.  Llevar un  registro diario de los alimentos que consume.  Control y registro de los niveles de glucosa en sangre.  Control de los niveles de cetona en la orina, aunque esto ya no se considera necesario en la mayora de los embarazos. INSTRUCCIONES PARA EL CUIDADO DOMICILIARIO Mientras est embarazada:  Siga los consejos de su mdico relacionados con los controles prenatales, la planificacin de la comida, la actividad fsica, los medicamentos, vitaminas, los anlisis de sangre y otras pruebas y las actividades fsicas.  Lleve un registro de las comidas, las pruebas de glucosa en sangre y la cantidad de insulina que recibe (si corresponde). Muestre todo al profesional en cada consulta mdica prenatal.  Si sufre diabetes mellitus gestacional, podr tener problemas de hipoglucemia (nivel bajo de glucosa en sangre). Podr sospechar este problema si se siente repentinamente mareada, tiene temblores y/o se siente dbil. Si cree que esto le est ocurriendo, y tiene un medidor de glucosa, mida su nivel de glucosa en sangre. Siga los consejos de su mdico sobre el modo y el momento de tratar su nivel de glucosa en sangre. Generalmente se sigue la regla 15:15 Consuma 15 g de hidratos de carbono, espere 15 minutos y vuelva controlar el nivel de glucosa en sangre.. Ejemplos de 15 g de hidratos de carbono son:  1 taza de leche descremada.   taza de jugo.  3-4 tabletas de glucosa.  5-6 caramelos duros.  1 caja pequea de pasas de uva.   taza de gaseosa comn.  Mantenga una buena higiene para evitar infecciones.  No fume. SOLICITE ATENCIN MDICA SI:  Observa prdida vaginal con o sin picazn.  Se siente ms dbil o cansada que lo habitual.  Transpira mucho.  Tiene un aumento de peso repentino, 2,5 kg o ms en una semana.  Pierde peso, 1.5 kg o ms en una semana.  Su nivel de glucosa en sangre es elevado, necesita instrucciones. SOLICITE ATENCIN MDICA DE INMEDIATO SI:  Sufre una cefalea  intensa.  Se marea o pierde el conocimiento  Presenta nuseas o vmitos.  Se siente desorientada confundida.  Sufre convulsiones.  Tiene problemas de visin.  Siente dolor en el estmago.  Presenta una hemorragia vaginal abundante.  Tiene contracciones uterinas.  Tiene una prdida importante de lquido por la vagina DESPUS QUE NACE EL BEB:  Concurra a todos los controles de seguimiento y realice los anlisis de sangre segn las indicaciones de su mdico.  Mantenga un estilo de vida saludable para evitar la diabetes en el futuro. Aqu se incluye:  Siga el plan de alimentacin saludable.  Controle su peso.  Practique actividad fsica y descanse lo necesario.  No fume.  Amamante a su beb mientras pueda. Esto disminuir la probabilidad de que usted y su beb sufran diabetes posteriormente. Para ms informacin acerca de la diabetes, visite la pgina web de la American Diabetes Association: www.americandiabetesassociation.org. Para ms informacin acerca de la diabetes gestacional cite la pgina web del American Congress of Obstetricians and Gynecologists en: www.acog.org. Document Released: 01/31/2005 Document Revised: 07/16/2011 ExitCare Patient Information 2013 [employer].  Lactancia materna  (Breastfeeding) Decidir amamantar es una de las mejores elecciones que puede hacer por usted y su beb. La informacin que se brinda a continuacin le dar una breve visin de los beneficios de la lactancia materna as como de las dudas ms frecuentes alrededor de ella.    LOS BENEFICIOS DE AMAMANTAR  Para el beb   La primera leche (calostro ) ayuda al mejor funcionamiento del sistema digestivo del beb.   La leche tiene anticuerpos que provienen de la madre y que ayudan a prevenir las infecciones en el beb.   El beb tiene una menor incidencia de asma, alergias y del sndrome de muerte sbita del lactante (SMSL).   Los nutrientes en la leche materna son mejores para  el beb que los preparados para lactantes y la leche materna ayuda a un mejor desarrollo del cerebro del beb.   Los bebs amamantados tienen menos gases, clicos y estreimiento.  Para la mam   La lactancia materna favorece el desarrollo de un vnculo muy especial entre la madre y el beb.   Es ms conveniente, siempre disponible y a la temperatura adecuada y econmico.   Consume caloras en la madre y la ayuda a perder el peso ganado durante el embarazo.   Favorece la contraccin del tero a su tamao normal, de manera ms rpida y disminuye las hemorragias luego del parto.   Las madres que amamantan tienen menor riesgo de desarrollar cncer de mama.  FRECUENCIA DEL AMAMANTAMIENTO   Un beb sano, nacido a trmino, puede amamantarse con tanta frecuencia como cada hora, o espaciar las comidas cada tres horas.   Observe al beb cuando manifieste signos de hambre. Amamante a su beb si muestra signos de hambre. Esta frecuencia variar de un beb a otro.   Amamntelo tan seguido como el beb lo solicite, o cuando usted sienta la necesidad de aliviar sus mamas.   Despierte al beb si han pasado 3  4 horas desde la ltima comida.   El amamantamiento frecuente la ayudar a producir ms leche y a prevenir problemas de dolor en los pezones e hinchazn de las mamas.  POSICIN DEL BEBE PARA EL AMAMANTAMIENTO   Ya sea que se encuentre acostada o sentada, asegrese que el abdomen del beb enfrente el suyo.   Sostenga la mama con el pulgar por arriba y los otros 4 dedos por debajo. Asegrese que sus dedos se encuentren lejos del pezn y de la boca del beb.   Empuje suavemente los labios del beb con el pezn o con el dedo.   Cuando la boca del beb se abra lo suficiente, introduzca el pezn y la areola tanto como le sea posible dentro de la boca.   Coloque al beb cerca suyo de modo que su nariz y mejillas toquen las mamas al mamar.  ALIMENTACIN Y SUCCIN   La duracin  de cada comida vara de un beb a otro y de una comida a otra.   El beb debe succionar entre 2 y 3 minutos para que le llegue leche. Esto se denomina "bajada". Por este motivo, permita que el nio se alimente en cada mama todo lo que desee. Terminar de mamar cuando haya recibido la cantidad adecuada de nutrientes.   Para detener la succin coloque su dedo en la comisura de la boca del nio y deslcelo entre sus encas antes de quitarle la mama de la boca. Esto la ayudar a evitar el dolor en los pezones.  COMO SABER SI EL BEB OBTIENE LA SUFICIENTE LECHE MATERNA  Preguntarse si el beb obtiene la cantidad suficiente de leche es una preocupacin frecuente entre las madres. Puede asegurarse que el beb tiene la leche suficiente si:   El beb succiona activamente y usted escucha que traga .   El beb parece estar   relajado y satisfecho despus de mamar.   El nio se alimenta al menos 8 a 12 veces en 24 horas. Alimntelo hasta que se desprenda por sus propios medios o se quede dormido en la primera mama (al menos durante 10 a 20 minutos), luego ofrzcale el otro lado.   El beb moja 5 a 6 paales desechables (6 a 8 paales de tela) en 24 horas cuando tiene 5  6 das de vida.   Tiene al menos 3 a 4 deposiciones todos los das en los primeros meses. La materia fecal debe ser blanda y amarillenta.   El beb debe aumentar 4 a 6 libras (120 a 170 gr.) por semana despus de los 4 das de vida.   Siente que las mamas se ablandan despus de amamantar  REDUCIR LA CONGESTIN DE LAS MAMAS   Durante la primera semana despus del parto, usted puede experimentar hinchazn en las mamas. Cuando las mamas estn congestionadas, se sienten calientes, llenas y molestas al tacto. Puede reducir la congestin si:   Lo amamanta frecuentemente, cada 2-3 horas. Las mams que amamantan pronto y con frecuencia tienen menos problemas de congestin.   Coloque compresas de hielo en sus mamas durante 10-20  minutos entre cada amamantamiento. Esto ayuda a reducir la hinchazn. Envuelva las bolsas de hielo en una toalla liviana para proteger su piel. Las bolsas de vegetales congelados funcionan bien para este propsito.   Tome una ducha tibia o aplique compresas hmedas calientes en las mamas durante 5 a 10 minutos antes de cada vez que amamanta. Esto aumenta la circulacin y ayuda a que la leche fluya.   Masajee suavemente la mama antes y durante la alimentacin. Con las puntas de los dedos, masajee desde la pared torcica hacia abajo hasta llegar al pezn, con movimientos circulares.   Asegrese que el nio vaca al menos una mama antes de cambiar de lado.   Use un sacaleche para vaciar la mama si el beb se duerme o no se alimenta bien. Tambin podr quitarse la leche con esa bomba si tiene que volver al trabajo o siente que las mamas estn congestionadas.   Evite los biberones, chupetes o complementar la alimentacin con agua o jugos en lugar de la leche materna. La leche materna es todo el alimento que el beb necesita. No es necesario que el nio ingiera agua o preparados de bibern. De hecho, es lo mejor para ayudar a que las mamas produzcan ms leche. no darle suplementos al nio durante las primeras semanas.   Verifique que el beb se encuentra en la posicin correcta mientras lo alimenta.   Use un sostn que soporte bien sus mamas y evite los que tienen aro.   Consuma una dieta balanceada y beba lquidos en cantidad.   Descanse con frecuencia, reljese y tome sus vitaminas prenatales para evitar la fatiga, el estrs y la anemia.  Si sigue estas indicaciones, la congestin debe mejorar en 24 a 48 horas. Si an tiene dificultades, consulte a su asesor en lactancia.  CUDESE USTED MISMA  Cuide sus mamas.   Bese o dchese diariamente.   Evite usar jabn en los pezones.   Comience a amamantar del lado izquierdo en una comida y del lado derecho en la siguiente.   Notar que  aumenta el flujo de leche a los 2 a 5 das despus del parto. Puede sentir algunas molestias por la congestin, lo que hace que sus mamas estn duras y sensibles. La congestin disminuye en 24 a 48   horas. Mientras tanto, aplique toallas hmedas calientes durante 5 a 10 minutos antes de amamantar. Un masaje suave y la extraccin de un poco de leche antes de amamantar ablandarn las mamas y har ms fcil que el beb se agarre.   Use un buen sostn y seque al aire los pezones durante 3 a 4 minutos luego de cada alimentacin.   Solo utilice apsitos de algodn.   Utilice lanolina pura sobre los pezones luego de amamantar. No necesita lavarlos luego de alimentar al nio. Otra opcin es exprimir algunas gotas de leche y masajear suavemente los pezones.  Cumpla con estos cuidados   Consuma alimentos bien balanceados y refrigerios nutritivos.   Beba leche, jugos de fruta y agua para satisfacer la sed (alrededor de 8 vasos por da).   Descanse lo suficiente.  Evite los alimentos que usted note que pueden afectar al beb.  SOLICITE ATENCIN MDICA SI:   Tiene dificultad con la lactancia materna y necesita ayuda.   Tiene una zona de color rojo, dura y dolorosa en la mama que se acompaa de fiebre.   El beb est muy somnoliento como para alimentarse bien o tiene problemas para dormir.   Su beb moja menos de 6 paales al da, a los 5 das de vida.   La piel del beb o la parte blanca de sus ojos est ms amarilla de lo que estaba en el hospital.   Se siente deprimida.  Document Released: 04/23/2005 Document Revised: 10/23/2011 ExitCare Patient Information 2013 [employer].  

## 2012-03-24 NOTE — Progress Notes (Signed)
Pulse: 81

## 2012-03-31 ENCOUNTER — Encounter: Payer: Self-pay | Admitting: Family Medicine

## 2012-03-31 ENCOUNTER — Encounter: Payer: Self-pay | Admitting: Dietician

## 2012-03-31 ENCOUNTER — Ambulatory Visit (INDEPENDENT_AMBULATORY_CARE_PROVIDER_SITE_OTHER): Payer: Self-pay | Admitting: Family Medicine

## 2012-03-31 VITALS — BP 108/67 | Temp 97.0°F | Wt 141.9 lb

## 2012-03-31 DIAGNOSIS — O99810 Abnormal glucose complicating pregnancy: Secondary | ICD-10-CM

## 2012-03-31 DIAGNOSIS — O24419 Gestational diabetes mellitus in pregnancy, unspecified control: Secondary | ICD-10-CM

## 2012-03-31 LAB — POCT URINALYSIS DIP (DEVICE)
Bilirubin Urine: NEGATIVE
Hgb urine dipstick: NEGATIVE
Ketones, ur: NEGATIVE mg/dL
Specific Gravity, Urine: 1.015 (ref 1.005–1.030)
pH: 7 (ref 5.0–8.0)

## 2012-03-31 NOTE — Patient Instructions (Signed)
Diabetes mellitus gestacional (Gestational Diabetes Mellitus) La diabetes mellitus gestacional se produce slo durante el embarazo. Aparece cuando el organismo no puede controlar adecuadamente la glucosa (azcar) que aumenta en la sangre despus de comer. Durante el embarazo, se produce una resistencia a la insulina (sensibilidad reducida a la insulina) debido a la liberacin de hormonas por parte de la placenta. Generalmente, el pncreas de una mujer embarazada produce la cantidad suficiente de insulina para vencer esa resistencia. Sin embargo, en la diabetes gestacional, hay insulina pero no cumple su funcin adecuadamente. Si la resistencia es lo suficientemente grave como para que el pncreas no produzca la cantidad de insulina suficiente, la glucosa extra se acumula en la sangre.  QUINES TIENEN RIESGO DE DESARROLLAR DIABETES GESTACIONAL?  Las mujeres con historia de diabetes en la familia.  Las mujeres de ms de 25 aos.  Las que presentan sobrepeso.  Las mujeres que pertenecen a ciertos grupos tnicos (latinas, afroamericanas, norteamericanas nativas, asiticas y las originarias de las islas del Pacf[employer]. QUE PUEDE OCURRIRLE AL BEB? Si el nivel de glucosa en sangre de la madre es demasiado elevado mientras este embarazada, el nivel extra de azcar pasar por el cordn umbilical hacia el beb. Algunos de los problemas del beb pueden ser:  Beb demasiado grande: si el nio recibe demasiada azcar, puede aumentar mucho de peso. Esto puede hacer que sea demasiado grande para nacer por parto normal (vaginal) por lo que ser necesario realizar una cesrea.  Bajo nivel de glucosa (hipoglucemia): el beb produce insulina extra en respuesta a la excesiva cantidad de azcar que obtiene de la madre. Cuando el beb nace y ya no necesita insulina extra, su nivel de azcar en sangre puede disminuir.  Ictericia (coloracin amarillenta de la piel y los ojos): esto es bastante frecuente en los bebs.  La causa es la acumulacin de una sustancia qumica denominada bilirrubina. No siempre es un trastorno grave, pero se observa con frecuencia en los bebs cuyas madres sufren diabetes gestacional. RIESGOS PARA LA MADRE Las mujeres que han sufrido diabetes gestacional pueden tener ms riesgos para algunos problemas como:  Preeclampsia o toxemia, incluyendo problemas con hipertensin arterial. La presin arterial y los niveles de protenas en la orina deben controlarse con frecuencia.  Infecciones  Parto por cesrea.  Aparicin de diabetes tipo 2 en una etapa posterior de la vida. Alrededor del 30% al 50% sufrir diabetes posteriormente, especialmente las que son obesas. DIAGNSTICO Las hormonas que causan resistencia a la insulina tienen su mayor nivel alrededor de las 24 a 28 semanas del embarazo. Si se experimentan sntomas, stos son similares a los sntomas que normalmente aparecen durante el embarazo.  La diabetes mellitus gestacional generalmente se diagnostica por medio de un mtodo en dos partes: 1. Despus de la 24 a 28 semanas de embarazo, la mujer debe beber una solucin que contiene glucosa y realizar un anlisis de sangre. Si el nivel de glucosa es elevado, la realizarn un segundo anlisis. 2. La prueba oral de tolerancia a la glucosa, que dura aproximadamente tres horas. Despus de realizar ayuno durante la noche, se controla nivel de glucosa en sangre. La mujer bebe una solucin que contiene glucosa y le realizan anlisis de glucosa en sangre cada hora. Si la mujer tiene factores de riesgos para la diabetes mellitus gestacional, el mdico podr indicar el anlisis antes de las 24 semanas de embarazo. TRATAMIENTO El tratamiento est dirigido a mantener la glucosa en sangre de la madre en un nivel normal y puede incluir:  La   planificacin de los alimentos.  Recibir insulina u otro medicamento para controlar el nivel de glucosa en sangre.  La prctica de ejercicios.  Llevar un  registro diario de los alimentos que consume.  Control y registro de los niveles de glucosa en sangre.  Control de los niveles de cetona en la orina, aunque esto ya no se considera necesario en la mayora de los embarazos. INSTRUCCIONES PARA EL CUIDADO DOMICILIARIO Mientras est embarazada:  Siga los consejos de su mdico relacionados con los controles prenatales, la planificacin de la comida, la actividad fsica, los medicamentos, vitaminas, los anlisis de sangre y otras pruebas y las actividades fsicas.  Lleve un registro de las comidas, las pruebas de glucosa en sangre y la cantidad de insulina que recibe (si corresponde). Muestre todo al profesional en cada consulta mdica prenatal.  Si sufre diabetes mellitus gestacional, podr tener problemas de hipoglucemia (nivel bajo de glucosa en sangre). Podr sospechar este problema si se siente repentinamente mareada, tiene temblores y/o se siente dbil. Si cree que esto le est ocurriendo, y tiene un medidor de glucosa, mida su nivel de glucosa en sangre. Siga los consejos de su mdico sobre el modo y el momento de tratar su nivel de glucosa en sangre. Generalmente se sigue la regla 15:15 Consuma 15 g de hidratos de carbono, espere 15 minutos y vuelva controlar el nivel de glucosa en sangre.. Ejemplos de 15 g de hidratos de carbono son:  1 taza de leche descremada.   taza de jugo.  3-4 tabletas de glucosa.  5-6 caramelos duros.  1 caja pequea de pasas de uva.   taza de gaseosa comn.  Mantenga una buena higiene para evitar infecciones.  No fume. SOLICITE ATENCIN MDICA SI:  Observa prdida vaginal con o sin picazn.  Se siente ms dbil o cansada que lo habitual.  Transpira mucho.  Tiene un aumento de peso repentino, 2,5 kg o ms en una semana.  Pierde peso, 1.5 kg o ms en una semana.  Su nivel de glucosa en sangre es elevado, necesita instrucciones. SOLICITE ATENCIN MDICA DE INMEDIATO SI:  Sufre una cefalea  intensa.  Se marea o pierde el conocimiento  Presenta nuseas o vmitos.  Se siente desorientada confundida.  Sufre convulsiones.  Tiene problemas de visin.  Siente dolor en el estmago.  Presenta una hemorragia vaginal abundante.  Tiene contracciones uterinas.  Tiene una prdida importante de lquido por la vagina DESPUS QUE NACE EL BEB:  Concurra a todos los controles de seguimiento y realice los anlisis de sangre segn las indicaciones de su mdico.  Mantenga un estilo de vida saludable para evitar la diabetes en el futuro. Aqu se incluye:  Siga el plan de alimentacin saludable.  Controle su peso.  Practique actividad fsica y descanse lo necesario.  No fume.  Amamante a su beb mientras pueda. Esto disminuir la probabilidad de que usted y su beb sufran diabetes posteriormente. Para ms informacin acerca de la diabetes, visite la pgina web de la American Diabetes Association: www.americandiabetesassociation.org. Para ms informacin acerca de la diabetes gestacional cite la pgina web del American Congress of Obstetricians and Gynecologists en: www.acog.org. Document Released: 01/31/2005 Document Revised: 07/16/2011 ExitCare Patient Information 2013 [employer].  Lactancia materna  (Breastfeeding) Decidir amamantar es una de las mejores elecciones que puede hacer por usted y su beb. La informacin que se brinda a continuacin le dar una breve visin de los beneficios de la lactancia materna as como de las dudas ms frecuentes alrededor de ella.    LOS BENEFICIOS DE AMAMANTAR  Para el beb   La primera leche (calostro ) ayuda al mejor funcionamiento del sistema digestivo del beb.   La leche tiene anticuerpos que provienen de la madre y que ayudan a prevenir las infecciones en el beb.   El beb tiene una menor incidencia de asma, alergias y del sndrome de muerte sbita del lactante (SMSL).   Los nutrientes en la leche materna son mejores para  el beb que los preparados para lactantes y la leche materna ayuda a un mejor desarrollo del cerebro del beb.   Los bebs amamantados tienen menos gases, clicos y estreimiento.  Para la mam   La lactancia materna favorece el desarrollo de un vnculo muy especial entre la madre y el beb.   Es ms conveniente, siempre disponible y a la temperatura adecuada y econmico.   Consume caloras en la madre y la ayuda a perder el peso ganado durante el embarazo.   Favorece la contraccin del tero a su tamao normal, de manera ms rpida y disminuye las hemorragias luego del parto.   Las madres que amamantan tienen menor riesgo de desarrollar cncer de mama.  FRECUENCIA DEL AMAMANTAMIENTO   Un beb sano, nacido a trmino, puede amamantarse con tanta frecuencia como cada hora, o espaciar las comidas cada tres horas.   Observe al beb cuando manifieste signos de hambre. Amamante a su beb si muestra signos de hambre. Esta frecuencia variar de un beb a otro.   Amamntelo tan seguido como el beb lo solicite, o cuando usted sienta la necesidad de aliviar sus mamas.   Despierte al beb si han pasado 3  4 horas desde la ltima comida.   El amamantamiento frecuente la ayudar a producir ms leche y a prevenir problemas de dolor en los pezones e hinchazn de las mamas.  POSICIN DEL BEBE PARA EL AMAMANTAMIENTO   Ya sea que se encuentre acostada o sentada, asegrese que el abdomen del beb enfrente el suyo.   Sostenga la mama con el pulgar por arriba y los otros 4 dedos por debajo. Asegrese que sus dedos se encuentren lejos del pezn y de la boca del beb.   Empuje suavemente los labios del beb con el pezn o con el dedo.   Cuando la boca del beb se abra lo suficiente, introduzca el pezn y la areola tanto como le sea posible dentro de la boca.   Coloque al beb cerca suyo de modo que su nariz y mejillas toquen las mamas al mamar.  ALIMENTACIN Y SUCCIN   La duracin  de cada comida vara de un beb a otro y de una comida a otra.   El beb debe succionar entre 2 y 3 minutos para que le llegue leche. Esto se denomina "bajada". Por este motivo, permita que el nio se alimente en cada mama todo lo que desee. Terminar de mamar cuando haya recibido la cantidad adecuada de nutrientes.   Para detener la succin coloque su dedo en la comisura de la boca del nio y deslcelo entre sus encas antes de quitarle la mama de la boca. Esto la ayudar a evitar el dolor en los pezones.  COMO SABER SI EL BEB OBTIENE LA SUFICIENTE LECHE MATERNA  Preguntarse si el beb obtiene la cantidad suficiente de leche es una preocupacin frecuente entre las madres. Puede asegurarse que el beb tiene la leche suficiente si:   El beb succiona activamente y usted escucha que traga .   El beb parece estar   relajado y satisfecho despus de mamar.   El nio se alimenta al menos 8 a 12 veces en 24 horas. Alimntelo hasta que se desprenda por sus propios medios o se quede dormido en la primera mama (al menos durante 10 a 20 minutos), luego ofrzcale el otro lado.   El beb moja 5 a 6 paales desechables (6 a 8 paales de tela) en 24 horas cuando tiene 5  6 das de vida.   Tiene al menos 3 a 4 deposiciones todos los das en los primeros meses. La materia fecal debe ser blanda y amarillenta.   El beb debe aumentar 4 a 6 libras (120 a 170 gr.) por semana despus de los 4 das de vida.   Siente que las mamas se ablandan despus de amamantar  REDUCIR LA CONGESTIN DE LAS MAMAS   Durante la primera semana despus del parto, usted puede experimentar hinchazn en las mamas. Cuando las mamas estn congestionadas, se sienten calientes, llenas y molestas al tacto. Puede reducir la congestin si:   Lo amamanta frecuentemente, cada 2-3 horas. Las mams que amamantan pronto y con frecuencia tienen menos problemas de congestin.   Coloque compresas de hielo en sus mamas durante 10-20  minutos entre cada amamantamiento. Esto ayuda a reducir la hinchazn. Envuelva las bolsas de hielo en una toalla liviana para proteger su piel. Las bolsas de vegetales congelados funcionan bien para este propsito.   Tome una ducha tibia o aplique compresas hmedas calientes en las mamas durante 5 a 10 minutos antes de cada vez que amamanta. Esto aumenta la circulacin y ayuda a que la leche fluya.   Masajee suavemente la mama antes y durante la alimentacin. Con las puntas de los dedos, masajee desde la pared torcica hacia abajo hasta llegar al pezn, con movimientos circulares.   Asegrese que el nio vaca al menos una mama antes de cambiar de lado.   Use un sacaleche para vaciar la mama si el beb se duerme o no se alimenta bien. Tambin podr quitarse la leche con esa bomba si tiene que volver al trabajo o siente que las mamas estn congestionadas.   Evite los biberones, chupetes o complementar la alimentacin con agua o jugos en lugar de la leche materna. La leche materna es todo el alimento que el beb necesita. No es necesario que el nio ingiera agua o preparados de bibern. De hecho, es lo mejor para ayudar a que las mamas produzcan ms leche. no darle suplementos al nio durante las primeras semanas.   Verifique que el beb se encuentra en la posicin correcta mientras lo alimenta.   Use un sostn que soporte bien sus mamas y evite los que tienen aro.   Consuma una dieta balanceada y beba lquidos en cantidad.   Descanse con frecuencia, reljese y tome sus vitaminas prenatales para evitar la fatiga, el estrs y la anemia.  Si sigue estas indicaciones, la congestin debe mejorar en 24 a 48 horas. Si an tiene dificultades, consulte a su asesor en lactancia.  CUDESE USTED MISMA  Cuide sus mamas.   Bese o dchese diariamente.   Evite usar jabn en los pezones.   Comience a amamantar del lado izquierdo en una comida y del lado derecho en la siguiente.   Notar que  aumenta el flujo de leche a los 2 a 5 das despus del parto. Puede sentir algunas molestias por la congestin, lo que hace que sus mamas estn duras y sensibles. La congestin disminuye en 24 a 48   horas. Mientras tanto, aplique toallas hmedas calientes durante 5 a 10 minutos antes de amamantar. Un masaje suave y la extraccin de un poco de leche antes de amamantar ablandarn las mamas y har ms fcil que el beb se agarre.   Use un buen sostn y seque al aire los pezones durante 3 a 4 minutos luego de cada alimentacin.   Solo utilice apsitos de algodn.   Utilice lanolina pura sobre los pezones luego de amamantar. No necesita lavarlos luego de alimentar al nio. Otra opcin es exprimir algunas gotas de leche y masajear suavemente los pezones.  Cumpla con estos cuidados   Consuma alimentos bien balanceados y refrigerios nutritivos.   Beba leche, jugos de fruta y agua para satisfacer la sed (alrededor de 8 vasos por da).   Descanse lo suficiente.  Evite los alimentos que usted note que pueden afectar al beb.  SOLICITE ATENCIN MDICA SI:   Tiene dificultad con la lactancia materna y necesita ayuda.   Tiene una zona de color rojo, dura y dolorosa en la mama que se acompaa de fiebre.   El beb est muy somnoliento como para alimentarse bien o tiene problemas para dormir.   Su beb moja menos de 6 paales al da, a los 5 das de vida.   La piel del beb o la parte blanca de sus ojos est ms amarilla de lo que estaba en el hospital.   Se siente deprimida.  Document Released: 04/23/2005 Document Revised: 10/23/2011 ExitCare Patient Information 2013 [employer].  

## 2012-03-31 NOTE — Progress Notes (Signed)
FBS 77-90 2 hr pp 90-149 3 of 9 out of range Will see [name] today.

## 2012-03-31 NOTE — Progress Notes (Signed)
Diabetes Education:  Having problems with the code coming up on the meter.  Briefly reviewed the process, and at that time the code was coming up.  She [name] not have had it in the meter tight enough.  Currently the meter code is coming up.  Will plan to follow-up at the next clinic visit.  She is to contact the clinic if she has any further problems over the next 24-48 hours.  Erika Lariyah Shetterly, RN, RD, CDE

## 2012-03-31 NOTE — Progress Notes (Signed)
Pulse- 80 

## 2012-04-07 ENCOUNTER — Ambulatory Visit (INDEPENDENT_AMBULATORY_CARE_PROVIDER_SITE_OTHER): Payer: Self-pay | Admitting: Family Medicine

## 2012-04-07 VITALS — BP 109/68 | Temp 97.9°F | Wt 141.7 lb

## 2012-04-07 DIAGNOSIS — O99810 Abnormal glucose complicating pregnancy: Secondary | ICD-10-CM

## 2012-04-07 LAB — POCT URINALYSIS DIP (DEVICE)
Bilirubin Urine: NEGATIVE
Ketones, ur: NEGATIVE mg/dL
Protein, ur: NEGATIVE mg/dL
Specific Gravity, Urine: 1.015 (ref 1.005–1.030)
pH: 7.5 (ref 5.0–8.0)

## 2012-04-07 NOTE — Progress Notes (Signed)
Pulse- 94 

## 2012-04-07 NOTE — Progress Notes (Signed)
FBS 77-111 7 of 12 out of range to work on hs snack. 2 hr pp 80-161 5 of 20 out of range To see [name] for lancets and meter check.

## 2012-04-07 NOTE — Patient Instructions (Signed)
Diabetes mellitus gestacional (Gestational Diabetes Mellitus) La diabetes mellitus gestacional se produce slo durante el embarazo. Aparece cuando el organismo no puede controlar adecuadamente la glucosa (azcar) que aumenta en la sangre despus de comer. Durante el embarazo, se produce una resistencia a la insulina (sensibilidad reducida a la insulina) debido a la liberacin de hormonas por parte de la placenta. Generalmente, el pncreas de una mujer embarazada produce la cantidad suficiente de insulina para vencer esa resistencia. Sin embargo, en la diabetes gestacional, hay insulina pero no cumple su funcin adecuadamente. Si la resistencia es lo suficientemente grave como para que el pncreas no produzca la cantidad de insulina suficiente, la glucosa extra se acumula en la sangre.  QUINES TIENEN RIESGO DE DESARROLLAR DIABETES GESTACIONAL?  Las mujeres con historia de diabetes en la familia.  Las mujeres de ms de 25 aos.  Las que presentan sobrepeso.  Las mujeres que pertenecen a ciertos grupos tnicos (latinas, afroamericanas, norteamericanas nativas, asiticas y las originarias de las islas del Pacf[employer]. QUE PUEDE OCURRIRLE AL BEB? Si el nivel de glucosa en sangre de la madre es demasiado elevado mientras este embarazada, el nivel extra de azcar pasar por el cordn umbilical hacia el beb. Algunos de los problemas del beb pueden ser:  Beb demasiado grande: si el nio recibe demasiada azcar, puede aumentar mucho de peso. Esto puede hacer que sea demasiado grande para nacer por parto normal (vaginal) por lo que ser necesario realizar una cesrea.  Bajo nivel de glucosa (hipoglucemia): el beb produce insulina extra en respuesta a la excesiva cantidad de azcar que obtiene de la madre. Cuando el beb nace y ya no necesita insulina extra, su nivel de azcar en sangre puede disminuir.  Ictericia (coloracin amarillenta de la piel y los ojos): esto es bastante frecuente en los bebs.  La causa es la acumulacin de una sustancia qumica denominada bilirrubina. No siempre es un trastorno grave, pero se observa con frecuencia en los bebs cuyas madres sufren diabetes gestacional. RIESGOS PARA LA MADRE Las mujeres que han sufrido diabetes gestacional pueden tener ms riesgos para algunos problemas como:  Preeclampsia o toxemia, incluyendo problemas con hipertensin arterial. La presin arterial y los niveles de protenas en la orina deben controlarse con frecuencia.  Infecciones  Parto por cesrea.  Aparicin de diabetes tipo 2 en una etapa posterior de la vida. Alrededor del 30% al 50% sufrir diabetes posteriormente, especialmente las que son obesas. DIAGNSTICO Las hormonas que causan resistencia a la insulina tienen su mayor nivel alrededor de las 24 a 28 semanas del embarazo. Si se experimentan sntomas, stos son similares a los sntomas que normalmente aparecen durante el embarazo.  La diabetes mellitus gestacional generalmente se diagnostica por medio de un mtodo en dos partes: 1. Despus de la 24 a 28 semanas de embarazo, la mujer debe beber una solucin que contiene glucosa y realizar un anlisis de sangre. Si el nivel de glucosa es elevado, la realizarn un segundo anlisis. 2. La prueba oral de tolerancia a la glucosa, que dura aproximadamente tres horas. Despus de realizar ayuno durante la noche, se controla nivel de glucosa en sangre. La mujer bebe una solucin que contiene glucosa y le realizan anlisis de glucosa en sangre cada hora. Si la mujer tiene factores de riesgos para la diabetes mellitus gestacional, el mdico podr indicar el anlisis antes de las 24 semanas de embarazo. TRATAMIENTO El tratamiento est dirigido a mantener la glucosa en sangre de la madre en un nivel normal y puede incluir:  La   planificacin de los alimentos.  Recibir insulina u otro medicamento para controlar el nivel de glucosa en sangre.  La prctica de ejercicios.  Llevar un  registro diario de los alimentos que consume.  Control y registro de los niveles de glucosa en sangre.  Control de los niveles de cetona en la orina, aunque esto ya no se considera necesario en la mayora de los embarazos. INSTRUCCIONES PARA EL CUIDADO DOMICILIARIO Mientras est embarazada:  Siga los consejos de su mdico relacionados con los controles prenatales, la planificacin de la comida, la actividad fsica, los medicamentos, vitaminas, los anlisis de sangre y otras pruebas y las actividades fsicas.  Lleve un registro de las comidas, las pruebas de glucosa en sangre y la cantidad de insulina que recibe (si corresponde). Muestre todo al profesional en cada consulta mdica prenatal.  Si sufre diabetes mellitus gestacional, podr tener problemas de hipoglucemia (nivel bajo de glucosa en sangre). Podr sospechar este problema si se siente repentinamente mareada, tiene temblores y/o se siente dbil. Si cree que esto le est ocurriendo, y tiene un medidor de glucosa, mida su nivel de glucosa en sangre. Siga los consejos de su mdico sobre el modo y el momento de tratar su nivel de glucosa en sangre. Generalmente se sigue la regla 15:15 Consuma 15 g de hidratos de carbono, espere 15 minutos y vuelva controlar el nivel de glucosa en sangre.. Ejemplos de 15 g de hidratos de carbono son:  1 taza de leche descremada.   taza de jugo.  3-4 tabletas de glucosa.  5-6 caramelos duros.  1 caja pequea de pasas de uva.   taza de gaseosa comn.  Mantenga una buena higiene para evitar infecciones.  No fume. SOLICITE ATENCIN MDICA SI:  Observa prdida vaginal con o sin picazn.  Se siente ms dbil o cansada que lo habitual.  Transpira mucho.  Tiene un aumento de peso repentino, 2,5 kg o ms en una semana.  Pierde peso, 1.5 kg o ms en una semana.  Su nivel de glucosa en sangre es elevado, necesita instrucciones. SOLICITE ATENCIN MDICA DE INMEDIATO SI:  Sufre una cefalea  intensa.  Se marea o pierde el conocimiento  Presenta nuseas o vmitos.  Se siente desorientada confundida.  Sufre convulsiones.  Tiene problemas de visin.  Siente dolor en el estmago.  Presenta una hemorragia vaginal abundante.  Tiene contracciones uterinas.  Tiene una prdida importante de lquido por la vagina DESPUS QUE NACE EL BEB:  Concurra a todos los controles de seguimiento y realice los anlisis de sangre segn las indicaciones de su mdico.  Mantenga un estilo de vida saludable para evitar la diabetes en el futuro. Aqu se incluye:  Siga el plan de alimentacin saludable.  Controle su peso.  Practique actividad fsica y descanse lo necesario.  No fume.  Amamante a su beb mientras pueda. Esto disminuir la probabilidad de que usted y su beb sufran diabetes posteriormente. Para ms informacin acerca de la diabetes, visite la pgina web de la American Diabetes Association: www.americandiabetesassociation.org. Para ms informacin acerca de la diabetes gestacional cite la pgina web del American Congress of Obstetricians and Gynecologists en: www.acog.org. Document Released: 01/31/2005 Document Revised: 07/16/2011 ExitCare Patient Information 2013 [employer].  Lactancia materna  (Breastfeeding) Decidir amamantar es una de las mejores elecciones que puede hacer por usted y su beb. La informacin que se brinda a continuacin le dar una breve visin de los beneficios de la lactancia materna as como de las dudas ms frecuentes alrededor de ella.    LOS BENEFICIOS DE AMAMANTAR  Para el beb   La primera leche (calostro ) ayuda al mejor funcionamiento del sistema digestivo del beb.   La leche tiene anticuerpos que provienen de la madre y que ayudan a prevenir las infecciones en el beb.   El beb tiene una menor incidencia de asma, alergias y del sndrome de muerte sbita del lactante (SMSL).   Los nutrientes en la leche materna son mejores para  el beb que los preparados para lactantes y la leche materna ayuda a un mejor desarrollo del cerebro del beb.   Los bebs amamantados tienen menos gases, clicos y estreimiento.  Para la mam   La lactancia materna favorece el desarrollo de un vnculo muy especial entre la madre y el beb.   Es ms conveniente, siempre disponible y a la temperatura adecuada y econmico.   Consume caloras en la madre y la ayuda a perder el peso ganado durante el embarazo.   Favorece la contraccin del tero a su tamao normal, de manera ms rpida y disminuye las hemorragias luego del parto.   Las madres que amamantan tienen menor riesgo de desarrollar cncer de mama.  FRECUENCIA DEL AMAMANTAMIENTO   Un beb sano, nacido a trmino, puede amamantarse con tanta frecuencia como cada hora, o espaciar las comidas cada tres horas.   Observe al beb cuando manifieste signos de hambre. Amamante a su beb si muestra signos de hambre. Esta frecuencia variar de un beb a otro.   Amamntelo tan seguido como el beb lo solicite, o cuando usted sienta la necesidad de aliviar sus mamas.   Despierte al beb si han pasado 3  4 horas desde la ltima comida.   El amamantamiento frecuente la ayudar a producir ms leche y a prevenir problemas de dolor en los pezones e hinchazn de las mamas.  POSICIN DEL BEBE PARA EL AMAMANTAMIENTO   Ya sea que se encuentre acostada o sentada, asegrese que el abdomen del beb enfrente el suyo.   Sostenga la mama con el pulgar por arriba y los otros 4 dedos por debajo. Asegrese que sus dedos se encuentren lejos del pezn y de la boca del beb.   Empuje suavemente los labios del beb con el pezn o con el dedo.   Cuando la boca del beb se abra lo suficiente, introduzca el pezn y la areola tanto como le sea posible dentro de la boca.   Coloque al beb cerca suyo de modo que su nariz y mejillas toquen las mamas al mamar.  ALIMENTACIN Y SUCCIN   La duracin  de cada comida vara de un beb a otro y de una comida a otra.   El beb debe succionar entre 2 y 3 minutos para que le llegue leche. Esto se denomina "bajada". Por este motivo, permita que el nio se alimente en cada mama todo lo que desee. Terminar de mamar cuando haya recibido la cantidad adecuada de nutrientes.   Para detener la succin coloque su dedo en la comisura de la boca del nio y deslcelo entre sus encas antes de quitarle la mama de la boca. Esto la ayudar a evitar el dolor en los pezones.  COMO SABER SI EL BEB OBTIENE LA SUFICIENTE LECHE MATERNA  Preguntarse si el beb obtiene la cantidad suficiente de leche es una preocupacin frecuente entre las madres. Puede asegurarse que el beb tiene la leche suficiente si:   El beb succiona activamente y usted escucha que traga .   El beb parece estar   relajado y satisfecho despus de mamar.   El nio se alimenta al menos 8 a 12 veces en 24 horas. Alimntelo hasta que se desprenda por sus propios medios o se quede dormido en la primera mama (al menos durante 10 a 20 minutos), luego ofrzcale el otro lado.   El beb moja 5 a 6 paales desechables (6 a 8 paales de tela) en 24 horas cuando tiene 5  6 das de vida.   Tiene al menos 3 a 4 deposiciones todos los das en los primeros meses. La materia fecal debe ser blanda y amarillenta.   El beb debe aumentar 4 a 6 libras (120 a 170 gr.) por semana despus de los 4 das de vida.   Siente que las mamas se ablandan despus de amamantar  REDUCIR LA CONGESTIN DE LAS MAMAS   Durante la primera semana despus del parto, usted puede experimentar hinchazn en las mamas. Cuando las mamas estn congestionadas, se sienten calientes, llenas y molestas al tacto. Puede reducir la congestin si:   Lo amamanta frecuentemente, cada 2-3 horas. Las mams que amamantan pronto y con frecuencia tienen menos problemas de congestin.   Coloque compresas de hielo en sus mamas durante 10-20  minutos entre cada amamantamiento. Esto ayuda a reducir la hinchazn. Envuelva las bolsas de hielo en una toalla liviana para proteger su piel. Las bolsas de vegetales congelados funcionan bien para este propsito.   Tome una ducha tibia o aplique compresas hmedas calientes en las mamas durante 5 a 10 minutos antes de cada vez que amamanta. Esto aumenta la circulacin y ayuda a que la leche fluya.   Masajee suavemente la mama antes y durante la alimentacin. Con las puntas de los dedos, masajee desde la pared torcica hacia abajo hasta llegar al pezn, con movimientos circulares.   Asegrese que el nio vaca al menos una mama antes de cambiar de lado.   Use un sacaleche para vaciar la mama si el beb se duerme o no se alimenta bien. Tambin podr quitarse la leche con esa bomba si tiene que volver al trabajo o siente que las mamas estn congestionadas.   Evite los biberones, chupetes o complementar la alimentacin con agua o jugos en lugar de la leche materna. La leche materna es todo el alimento que el beb necesita. No es necesario que el nio ingiera agua o preparados de bibern. De hecho, es lo mejor para ayudar a que las mamas produzcan ms leche. no darle suplementos al nio durante las primeras semanas.   Verifique que el beb se encuentra en la posicin correcta mientras lo alimenta.   Use un sostn que soporte bien sus mamas y evite los que tienen aro.   Consuma una dieta balanceada y beba lquidos en cantidad.   Descanse con frecuencia, reljese y tome sus vitaminas prenatales para evitar la fatiga, el estrs y la anemia.  Si sigue estas indicaciones, la congestin debe mejorar en 24 a 48 horas. Si an tiene dificultades, consulte a su asesor en lactancia.  CUDESE USTED MISMA  Cuide sus mamas.   Bese o dchese diariamente.   Evite usar jabn en los pezones.   Comience a amamantar del lado izquierdo en una comida y del lado derecho en la siguiente.   Notar que  aumenta el flujo de leche a los 2 a 5 das despus del parto. Puede sentir algunas molestias por la congestin, lo que hace que sus mamas estn duras y sensibles. La congestin disminuye en 24 a 48   horas. Mientras tanto, aplique toallas hmedas calientes durante 5 a 10 minutos antes de amamantar. Un masaje suave y la extraccin de un poco de leche antes de amamantar ablandarn las mamas y har ms fcil que el beb se agarre.   Use un buen sostn y seque al aire los pezones durante 3 a 4 minutos luego de cada alimentacin.   Solo utilice apsitos de algodn.   Utilice lanolina pura sobre los pezones luego de amamantar. No necesita lavarlos luego de alimentar al nio. Otra opcin es exprimir algunas gotas de leche y masajear suavemente los pezones.  Cumpla con estos cuidados   Consuma alimentos bien balanceados y refrigerios nutritivos.   Beba leche, jugos de fruta y agua para satisfacer la sed (alrededor de 8 vasos por da).   Descanse lo suficiente.  Evite los alimentos que usted note que pueden afectar al beb.  SOLICITE ATENCIN MDICA SI:   Tiene dificultad con la lactancia materna y necesita ayuda.   Tiene una zona de color rojo, dura y dolorosa en la mama que se acompaa de fiebre.   El beb est muy somnoliento como para alimentarse bien o tiene problemas para dormir.   Su beb moja menos de 6 paales al da, a los 5 das de vida.   La piel del beb o la parte blanca de sus ojos est ms amarilla de lo que estaba en el hospital.   Se siente deprimida.  Document Released: 04/23/2005 Document Revised: 10/23/2011 ExitCare Patient Information 2013 [employer].  

## 2012-04-14 ENCOUNTER — Ambulatory Visit (INDEPENDENT_AMBULATORY_CARE_PROVIDER_SITE_OTHER): Payer: Self-pay | Admitting: Obstetrics and Gynecology

## 2012-04-14 VITALS — BP 104/62 | Temp 97.1°F | Wt 144.7 lb

## 2012-04-14 DIAGNOSIS — O24410 Gestational diabetes mellitus in pregnancy, diet controlled: Secondary | ICD-10-CM

## 2012-04-14 DIAGNOSIS — Z98891 History of uterine scar from previous surgery: Secondary | ICD-10-CM

## 2012-04-14 DIAGNOSIS — O99810 Abnormal glucose complicating pregnancy: Secondary | ICD-10-CM

## 2012-04-14 DIAGNOSIS — Z9889 Other specified postprocedural states: Secondary | ICD-10-CM

## 2012-04-14 DIAGNOSIS — O0990 Supervision of high risk pregnancy, unspecified, unspecified trimester: Secondary | ICD-10-CM

## 2012-04-14 LAB — POCT URINALYSIS DIP (DEVICE)
Hgb urine dipstick: NEGATIVE
Ketones, ur: NEGATIVE mg/dL
Protein, ur: 30 mg/dL — AB
Specific Gravity, Urine: 1.02 (ref 1.005–1.030)
Urobilinogen, UA: 0.2 mg/dL (ref 0.0–1.0)

## 2012-04-14 MED ORDER — GLUCOSE BLOOD VI STRP: ORAL_STRIP | Status: DC

## 2012-04-14 NOTE — Progress Notes (Signed)
Pulse: 81

## 2012-04-14 NOTE — Progress Notes (Signed)
Diabetes Education:  Provided one box strips [id number]  Exp:2016/30/06.  [name] Angelito Hopping, RN, RD, CDE

## 2012-04-14 NOTE — Progress Notes (Signed)
Doing well. S=D. Had eelvataed FBS 103,104 ; all other of last 9 were below 100. PP CBGs all well within range. Still desires TOLAC.Depo discussed.

## 2012-04-14 NOTE — Patient Instructions (Addendum)
Mtodo para contar los movimientos fetales (Fetal Movement Counts) Nombre de la paciente: __________________________________________________ Franco Nones probable de parto:____________________ En los embarazos de alto riesgo se recomienda contar las pataditas, pero tambin es una buena idea que lo hagan todas las Yeager. Comience a contarlas a las 28 semanas de embarazo. Los movimientos fetales aumentan luego de una comida Immunologist o de comer o beber algo dulce (el nivel de azcar en la sangre est ms alto). Tambin es importante beber gran cantidad de lquidos (hidratarse bien) antes de contar. Si se recuesta sobre el lado izquierdo mejorar la Designer, industrial/product, o puede sentarse en una silla cmoda con los brazos sobre el abdomen y sin distracciones que la rodeen. CONTANDO  Trate de contar a la [employer] lo haga.  Marque el da y la hora y vea cunto le lleva sentir 10 movimientos (patadas, agitaciones, sacudones, vueltas). Debe sentir al menos 10 movimientos en 2 horas. Probablemente sienta los 10 movimientos en menos de dos horas. Si no los siente, espere una hora y cuente nuevamente. Luego de [employer] tendr un patrn.  Debemos observar si hay cambios en el patrn o no hay suficientes pataditas en 2 horas. Le lleva ms tiempo contar los 10 movimientos? SOLICITE ATENCIN MDICA SI:  Siente menos de 10 pataditas en 2 horas. Intntelo dos veces.  No siente movimientos durante 1 hora.  El patrn se modifica o le lleva ms tiempo Art gallery manager las 10 pataditas.  Siente que el beb no se mueve como lo hace habitualmente. Fecha: ____________ Movimientos: ____________ Comienzo hora: ____________ Cephas Darby: ____________ Franco Nones: ____________ Movimientos: ____________ Comienzo hora: ____________ Cephas Darby: ____________ Franco Nones: ____________ Movimientos: ____________ Comienzo hora: ____________ Cephas Darby: ____________ Franco Nones: ____________ Movimientos: ____________ Comienzo hora:  ____________ Cephas Darby: ____________ Franco Nones: ____________ Movimientos: ____________ Comienzo hora: ____________ Cephas Darby: ____________ Franco Nones: ____________ Movimientos: ____________ Comienzo hora: ____________ Cephas Darby: ____________ Franco Nones: ____________ Movimientos: ____________ Comienzo hora: ____________ Cephas Darby: ____________  Franco Nones: ____________ Movimientos: ____________ Comienzo hora: ____________ Cephas Darby: ____________ Franco Nones: ____________ Movimientos: ____________ Comienzo hora: ____________ Cephas Darby: ____________ Franco Nones: ____________ Movimientos: ____________ Comienzo hora: ____________ Cephas Darby: ____________ Franco Nones: ____________ Movimientos: ____________ Comienzo hora: ____________ Cephas Darby: ____________ Franco Nones: ____________ Movimientos: ____________ Comienzo hora: ____________ Cephas Darby: ____________ Franco Nones: ____________ Movimientos: ____________ Comienzo hora: ____________ Cephas Darby: ____________ Franco Nones: ____________ Movimientos: ____________ Comienzo hora: ____________ Cephas Darby: ____________  Franco Nones: ____________ Movimientos: ____________ Comienzo hora: ____________ Cephas Darby: ____________ Franco Nones: ____________ Movimientos: ____________ Comienzo hora: ____________ Cephas Darby: ____________ Franco Nones: ____________ Movimientos: ____________ Comienzo hora: ____________ Cephas Darby: ____________ Franco Nones: ____________ Movimientos: ____________ Comienzo hora: ____________ Cephas Darby: ____________ Franco Nones: ____________ Movimientos: ____________ Comienzo hora: ____________ Cephas Darby: ____________ Franco Nones: ____________ Movimientos: ____________ Comienzo hora: ____________ Cephas Darby: ____________ Franco Nones: ____________ Movimientos: ____________ Comienzo hora: ____________ Cephas Darby: ____________  Franco Nones: ____________ Movimientos: ____________ Comienzo hora: ____________ Cephas Darby: ____________ Franco Nones: ____________ Movimientos: ____________ Comienzo hora: ____________ Cephas Darby: ____________ Franco Nones: ____________ Movimientos: ____________  Comienzo hora: ____________ Cephas Darby: ____________ Franco Nones: ____________ Movimientos: ____________ Comienzo hora: ____________ Cephas Darby: ____________ Franco Nones: ____________ Movimientos: ____________ Comienzo hora: ____________ Cephas Darby: ____________ Franco Nones: ____________ Movimientos: ____________ Comienzo hora: ____________ Cephas Darby: ____________ Franco Nones: ____________ Movimientos: ____________ Comienzo hora: ____________ Cephas Darby: ____________  Franco Nones: ____________ Movimientos: ____________ Comienzo hora: ____________ Cephas Darby: ____________ Franco Nones: ____________ Movimientos: ____________ Comienzo hora: ____________ Cephas Darby: ____________ Franco Nones: ____________ Movimientos: ____________ Comienzo hora: ____________ Cephas Darby: ____________ Franco Nones: ____________ Movimientos: ____________ Comienzo hora: ____________ Cephas Darby: ____________ Franco Nones: ____________ Movimientos: ____________ Comienzo hora: ____________ Fin hora: ____________  Fecha: ____________ Movimientos: ____________ Comienzo hora: ____________ Cephas Darby: ____________ Franco Nones: ____________ Movimientos: ____________ Comienzo hora: ____________ Cephas Darby: ____________  Franco Nones: ____________ Movimientos: ____________ Comienzo hora: ____________ Cephas Darby: ____________ Franco Nones: ____________ Movimientos: ____________ Comienzo hora: ____________ Cephas Darby: ____________ Franco Nones: ____________ Movimientos: ____________ Comienzo hora: ____________ Cephas Darby: ____________ Franco Nones: ____________ Movimientos: ____________ Comienzo hora: ____________ Cephas Darby: ____________ Franco Nones: ____________ Movimientos: ____________ Comienzo hora: ____________ Cephas Darby: ____________ Franco Nones: ____________ Movimientos: ____________ Comienzo hora: ____________ Cephas Darby: ____________ Franco Nones: ____________ Movimientos: ____________ Comienzo hora: ____________ Cephas Darby: ____________  Franco Nones: ____________ Movimientos: ____________ Comienzo hora: ____________ Cephas Darby: ____________ Franco Nones: ____________ Movimientos:  ____________ Comienzo hora: ____________ Cephas Darby: ____________ Franco Nones: ____________ Movimientos: ____________ Comienzo hora: ____________ Cephas Darby: ____________ Franco Nones: ____________ Movimientos: ____________ Comienzo hora: ____________ Cephas Darby: ____________ Franco Nones: ____________ Movimientos: ____________ Comienzo hora: ____________ Cephas Darby: ____________ Franco Nones: ____________ Movimientos: ____________ Comienzo hora: ____________ Cephas Darby: ____________ Franco Nones: ____________ Movimientos: ____________ Comienzo hora: ____________ Cephas Darby: ____________  Franco Nones: ____________ Movimientos: ____________ Comienzo hora: ____________ Cephas Darby: ____________ Franco Nones: ____________ Movimientos: ____________ Comienzo hora: ____________ Cephas Darby: ____________ Franco Nones: ____________ Movimientos: ____________ Comienzo hora: ____________ Cephas Darby: ____________ Franco Nones: ____________ Movimientos: ____________ Comienzo hora: ____________ Cephas Darby: ____________ Franco Nones: ____________ Movimientos: ____________ Comienzo hora: ____________ Cephas Darby: ____________ Franco Nones: ____________ Movimientos: ____________ Comienzo hora: ____________ Cephas Darby: ____________ Franco Nones: ____________ Movimientos: ____________ Comienzo hora: ____________ Fin hora: ____________  Document Released: 07/31/2007 Document Revised: 07/16/2011 ExitCare Patient Information 2013 [place], [place]. Lactancia materna  (Breastfeeding) Decidir Museum/gallery exhibitions officer es una de las mejores elecciones que puede hacer por usted y su beb. La informacin que se brinda a Psychologist, clinical dar una breve visin de los beneficios de la lactancia materna as como de las dudas ms frecuentes alrededor de ella.  LOS BENEFICIOS DE AMAMANTAR  Para el beb   La primera leche (calostro ) ayuda al mejor funcionamiento del sistema digestivo del beb.   La leche tiene anticuerpos que provienen de la madre y que ayudan a prevenir las infecciones en el beb.   El beb tiene una menor incidencia de asma,  alergias y del sndrome de muerte sbita del lactante (SMSL).   Los nutrientes en la [place] materna son mejores para el beb que los preparados para lactantes y la [place] materna ayuda a un mejor desarrollo del cerebro del beb.   Los bebs amamantados tienen menos gases, clicos y estreimiento.  Para la mam   La lactancia materna favorece el desarrollo de un vnculo muy especial entre la madre y el beb.   Es ms conveniente, siempre disponible y a Landscape architect y Film/video editor.   Consume caloras en la madre y la ayuda a perder el peso ganado durante el [place].   Favorece la contraccin del tero a su tamao normal, de manera ms rpida y [employer] las hemorragias luego del [place].   Las [employer] que amamantan tienen menor riesgo de Geophysical data processor de mama.  FRECUENCIA DEL AMAMANTAMIENTO   Un beb sano, nacido a trmino, puede amamantarse con tanta frecuencia como cada hora, o espaciar las comidas cada tres horas.   Observe al beb cuando manifieste signos de hambre. Amamante a su beb si muestra signos de hambre. Esta frecuencia variar de un beb a otro.   Amamntelo tan seguido como el beb lo solicite, o cuando usted sienta la necesidad de Paramedic sus [place].   Despierte al beb si han pasado 3  4 horas desde la ltima comida.   El amamantamiento frecuente la ayudar a producir ms [place] y a Radio producer  problemas de dolor en los pezones e hinchazn de las [place].  POSICIN DEL BEBE PARA EL AMAMANTAMIENTO   Ya sea que se encuentre [place] o sentada, asegrese que el abdomen del beb enfrente el suyo.   Sostenga la mama con el pulgar por arriba y los otros 4 dedos por debajo. Asegrese que sus dedos se encuentren lejos del pezn y de la boca del beb.   Empuje suavemente los labios del beb con el pezn o con el dedo.   Cuando la boca del beb se abra lo suficiente, introduzca el pezn y la areola tanto como le sea posible dentro de la boca.   Coloque al  beb cerca suyo de modo que su nariz y mejillas toquen las mamas al [employer].  ALIMENTACIN Y SUCCIN   La duracin de cada comida vara de un beb a otro y de Neomia Dear comida a [place].   El beb debe succionar entre 2 y 3 minutos para que Development worker, international aid. Esto se denomina "bajada". Por este motivo, permita que el nio se alimente en cada mama todo lo que desee. Terminar de mamar cuando haya recibido la cantidad [place] de nutrientes.   Para detener la succin coloque su dedo en la comisura de la boca del nio y Midwife entre sus encas antes de quitarle la mama de la boca. Esto la ayudar a English as a second language teacher.  COMO SABER SI EL BEB OBTIENE LA SUFICIENTE LECHE MATERNA  Preguntarse si el beb obtiene la cantidad suficiente de [place] es una preocupacin frecuente [employer]. Puede asegurarse que el beb tiene la leche suficiente si:   El beb succiona activamente y usted escucha que traga .   El beb parece estar relajado y satisfecho despus de Psychologist, clinical.   El nio se alimenta al menos 8 a 12 veces en 24 horas. Alimntelo hasta que se desprenda por sus propios medios o se quede dormido en la primera mama (al menos durante 10 a 20 minutos), luego ofrzcale el otro lado.   El beb moja 5 a 6 paales desechables (6 a 8 paales de tela) en 24 horas cuando tiene 5  6 das de vida.   Tiene al [employer] 3 a 4 deposiciones todos los [employer] primeros meses. La materia fecal debe ser blanda y [place].   El beb debe aumentar 4 a 6 libras (120 a 170 gr.) por semana despus de los 4 [address] de vida.   Siente que las mamas se ablandan despus de amamantar  REDUCIR LA CONGESTIN DE LAS MAMAS   Durante la primera semana despus del [place], usted puede experimentar hinchazn en las mamas. Cuando las mamas estn congestionadas, se sienten calientes, llenas y molestas al tacto. Puede reducir la congestin si:   Lo amamanta frecuentemente, cada 2-3 horas. Las mams que [employer] pronto y  con frecuencia tienen menos problemas de [place].   Coloque compresas de hielo en sus mamas durante 10-20 minutos entre cada amamantamiento. Esto ayuda a Building services engineer. Envuelva las bolsas de hielo en una toalla liviana para proteger su piel. Las bolsas de vegetales congelados funcionan bien para este propsito.   Tome una ducha tibia o aplique compresas hmedas calientes en las mamas durante 5 a 10 minutos antes de cada vez que [place]. Esto aumenta la circulacin y [place] a que la [place].   Masajee suavemente la mama antes y Psychologist, sport and exercise. Con las puntas de los dedos, masajee desde la pared torcica hacia abajo [place]  llegar al pezn, con movimientos circulares.   Asegrese que el nio vaca al menos una mama antes de cambiar de lado.   Use un sacaleche para vaciar la mama si el beb se duerme o no se alimenta bien. Tambin podr [employer] con esa bomba si tiene que volver al trabajo o siente que las mamas estn congestionadas.   Evite los biberones, chupetes o complementar la alimentacin con agua o jugos en lugar de la [place]. La leche materna es todo el alimento que el beb necesita. No es necesario que el nio ingiera agua o preparados de bibern. Louann Liv, es lo mejor para ayudar a que las mamas produzcan ms [employer]. no darle suplementos al [employer] las primeras semanas.   Verifique que el beb se encuentra en la posicin correcta mientras lo alimenta.   Use un sostn que soporte bien sus mamas y [employer] que tienen aro.   Consuma una dieta balanceada y beba lquidos en cantidad.   Descanse con frecuencia, reljese y tome sus vitaminas prenatales para evitar la fatiga, el estrs y la anemia.  Si sigue estas indicaciones, la congestin debe mejorar en 24 a 48 horas. Si an tiene dificultades, consulte a Barista.  CUDESE USTED MISMA  Cuide sus mamas.   Bese o dchese diariamente.   Evite usar [employer].   Comience a amamantar del lado izquierdo en una comida y del lado derecho en la siguiente.   Notar que aumenta el flujo de [place] a los 2 a 5 [address] despus del [address]. Puede sentir algunas molestias por la congestin, lo que hace que sus mamas estn duras y sensibles. La congestin disminuye en 24 a 48 horas. Mientras tanto, aplique toallas hmedas calientes durante 5 a 10 minutos antes de amamantar. Un masaje suave y la extraccin de un poco de leche antes de Museum/gallery exhibitions officer ablandarn las mamas y har ms fcil que el beb se agarre.   Use un buen sostn y seque al aire los pezones durante 3 a 4 minutos luego de Wellsite geologist.   Solo utilice apsitos de algodn.   Utilice lanolina [employer] pezones luego de [place]. No necesita lavarlos luego de alimentar al [employer]. Otra opcin es exprimir algunas gotas de [place] y [employer] pezones.  Cumpla con estos cuidados   Consuma alimentos bien balanceados y refrigerios nutritivos.   Dixie Dials, jugos de fruta y agua para Warehouse manager sed (alrededor de 8 vasos por Futures trader).   Descanse lo suficiente.  Evite los alimentos que usted note que pueden afectar al beb.  SOLICITE ATENCIN MDICA SI:   Tiene dificultad con la lactancia materna y [place].   Tiene una zona de color rojo, dura y dolorosa en la mama que se acompaa de [place].   El beb est muy somnoliento como para alimentarse bien o tiene problemas para dormir.   Su beb moja menos de 6 paales al [address], a los 5 [address]  [address] de vida.   La piel del beb o la parte blanca de sus ojos est ms amarilla de lo que estaba en el hospital.   Se siente deprimida.  Document Released: 04/23/2005 Document Revised: 10/23/2011 [facility] Patient Information 2013 [place], [place].

## 2012-04-21 ENCOUNTER — Ambulatory Visit (INDEPENDENT_AMBULATORY_CARE_PROVIDER_SITE_OTHER): Payer: Self-pay | Admitting: Family

## 2012-04-21 VITALS — BP 101/63 | Temp 96.6°F | Wt 145.0 lb

## 2012-04-21 DIAGNOSIS — O99810 Abnormal glucose complicating pregnancy: Secondary | ICD-10-CM

## 2012-04-21 DIAGNOSIS — O34219 Maternal care for unspecified type scar from previous cesarean delivery: Secondary | ICD-10-CM

## 2012-04-21 DIAGNOSIS — O0990 Supervision of high risk pregnancy, unspecified, unspecified trimester: Secondary | ICD-10-CM

## 2012-04-21 LAB — POCT URINALYSIS DIP (DEVICE)
Bilirubin Urine: NEGATIVE
Hgb urine dipstick: NEGATIVE
Nitrite: NEGATIVE
Specific Gravity, Urine: 1.02 (ref 1.005–1.030)
pH: 7 (ref 5.0–8.0)

## 2012-04-21 NOTE — Progress Notes (Signed)
No questions or concerns; reviewed blood sugars 85-101 (4/6 abnml), 2hPB   2hPL  2h PD ; pt reports walking 2-3x week; reviewed chart with Dr. Shawnie Pons > may delay meds for one additional week, if continues to be elevated will begin meds and 2x/week testing; emphasized importance of checking all blood sugars and increasing exercise; pt also reports white dc for past three days, small amount, no odor or itching;  Sterile spec - no pooling, thick white creamy discharge, no odor, negative ferns > wet prep to lab.

## 2012-04-24 ENCOUNTER — Telehealth: Payer: Self-pay | Admitting: *Deleted

## 2012-04-24 ENCOUNTER — Other Ambulatory Visit: Payer: Self-pay | Admitting: Family

## 2012-04-24 MED ORDER — METRONIDAZOLE 500 MG PO TABS: 500.0000 mg | ORAL_TABLET | Freq: Two times a day (BID) | ORAL | Status: DC

## 2012-04-24 NOTE — Telephone Encounter (Signed)
Called pt w/interpreter- Marlynn Perking. Pt was informed that the midwife E[facility]) who saw her on 12/16 would like her to come in tomorrow for NST. This is important because she has had some abnormal blood sugar readings and we need to check on the baby.  Pt voiced understanding and agreed to come in @ 1000 tomorrow.

## 2012-04-25 ENCOUNTER — Ambulatory Visit (INDEPENDENT_AMBULATORY_CARE_PROVIDER_SITE_OTHER): Payer: Self-pay | Admitting: *Deleted

## 2012-04-25 VITALS — BP 109/64 | Wt 146.5 lb

## 2012-04-25 DIAGNOSIS — O99810 Abnormal glucose complicating pregnancy: Secondary | ICD-10-CM

## 2012-04-25 NOTE — Progress Notes (Signed)
P = 79   Pt informed of +BV and need for Rx- voiced understanding. Interpreter-Dorita present for visit.

## 2012-04-28 ENCOUNTER — Ambulatory Visit (INDEPENDENT_AMBULATORY_CARE_PROVIDER_SITE_OTHER): Payer: No Typology Code available for payment source | Admitting: Family Medicine

## 2012-04-28 ENCOUNTER — Encounter: Payer: Self-pay | Admitting: Family Medicine

## 2012-04-28 VITALS — BP 105/64 | Temp 97.2°F | Wt 146.0 lb

## 2012-04-28 DIAGNOSIS — O99810 Abnormal glucose complicating pregnancy: Secondary | ICD-10-CM

## 2012-04-28 LAB — POCT URINALYSIS DIP (DEVICE)
Hgb urine dipstick: NEGATIVE
Ketones, ur: NEGATIVE mg/dL
Leukocytes, UA: NEGATIVE
Protein, ur: NEGATIVE mg/dL
pH: 7.5 (ref 5.0–8.0)

## 2012-04-28 NOTE — Patient Instructions (Signed)
Diabetes mellitus gestacional (Gestational Diabetes Mellitus) La diabetes mellitus gestacional se produce slo durante el embarazo. Aparece cuando el organismo no puede controlar adecuadamente la glucosa (azcar) que aumenta en la sangre despus de comer. Durante el [place], se produce una resistencia a la insulina (sensibilidad reducida a la insulina) debido a la liberacin de hormonas por parte de la placenta. Generalmente, el pncreas de una mujer embarazada produce la cantidad suficiente de insulina para vencer esa resistencia. Sin embargo, en la diabetes gestacional, hay insulina pero no cumple su funcin adecuadamente. Si la resistencia es lo suficientemente grave como para que el pncreas no produzca la cantidad de insulina suficiente, la glucosa extra se acumula en la sangre.  [name] RIESGO DE DESARROLLAR DIABETES GESTACIONAL?  Las mujeres con historia de diabetes en la familia.  Las mujeres de ms de [address]  Las que presentan sobrepeso.  Las [employer] pertenecen a ciertos grupos tnicos (latinas, afroamericanas, norteamericanas nativas, asiticas y las originarias de las islas del Pacfico. QUE PUEDE OCURRIRLE AL BEB? Si el nivel de glucosa en sangre de la madre es demasiado elevado mientras este [place], el nivel extra de azcar pasar por el cordn umbilical hacia el beb. Algunos de los problemas del beb pueden ser:  Beb demasiado grande: si el nio recibe Chief Strategy Officer, puede aumentar mucho de [place]. Esto puede hacer que sea demasiado grande para nacer por parto normal (vaginal) por lo que ser necesario realizar una cesrea.  Bajo nivel de glucosa (hipoglucemia): el beb produce insulina extra en respuesta a la excesiva cantidad de azcar que obtiene de [employer]. Cuando el beb nace y ya no necesita insulina extra, su nivel de azcar en sangre puede disminuir.  Ictericia (coloracin amarillenta de la piel y los ojos): esto es bastante frecuente en los bebs.  La causa es la acumulacin de una sustancia qumica denominada bilirrubina. No siempre es un trastorno grave, pero se observa con frecuencia en los bebs cuyas madres sufren diabetes gestacional. RIESGOS PARA LA MADRE Las mujeres que han sufrido diabetes gestacional pueden tener ms riesgos para algunos problemas como:  Preeclampsia o toxemia, incluyendo problemas con hipertensin arterial. La presin arterial y los niveles de protenas en la orina deben controlarse con frecuencia.  Infecciones  Parto por cesrea.  Aparicin de diabetes tipo 2 en una etapa posterior de la vida. Alrededor del 30% al 50% sufrir diabetes posteriormente, especialmente las que son obesas. DIAGNSTICO Las hormonas que causan resistencia a la insulina tienen su mayor nivel alrededor de las 24 a 28 semanas del Psychiatrist. Si se experimentan sntomas, stos son similares a los sntomas que normalmente aparecen durante el embarazo.  La diabetes mellitus gestacional generalmente se diagnostica por medio de un mtodo en dos partes: 1. Despus de la 24 a 28 semanas de Psychiatrist, la mujer debe beber una solucin que contiene glucosa y Education officer, environmental un anlisis de [place]. Si el nivel de glucosa es elevado, la realizarn un segundo [place]. 2. La prueba oral de tolerancia a la glucosa, que dura aproximadamente tres horas. Despus de realizar ayuno durante la noche, se controla nivel de glucosa en sangre. La mujer bebe una solucin que contiene glucosa y Chief Executive Officer realizan anlisis de glucosa en sangre cada hora. Si la mujer tiene factores de riesgos para la diabetes mellitus gestacional, el mdico podr Programme researcher, broadcasting/film/video anlisis antes de las 24 semanas de [place]. TRATAMIENTO El tratamiento est dirigido a Insurance underwriter en sangre de la madre en un nivel normal y puede incluir:  La  planificacin de los alimentos.  Recibir insulina u otro medicamento para Sales executive nivel de glucosa en [name].  La prctica de ejercicios.  Llevar un  registro diario de los alimentos que consume.  Control y Engineer, maintenance (IT) de los niveles de glucosa en [name].  Control de los niveles de cetona en la [name], [place] esto ya no se considera necesario en la mayora de los [name]. INSTRUCCIONES PARA EL CUIDADO DOMICILIARIO Mientras est embarazada:  Siga los consejos de su mdico relacionados con los controles prenatales, la planificacin de la comida, la actividad fsica, los [place], vitaminas, los anlisis de sangre y otras pruebas y las actividades fsicas.  Lleve un registro de las comidas, las pruebas de glucosa en sangre y la cantidad de insulina que recibe (si corresponde). Muestre todo al profesional en cada consulta mdica prenatal.  Si sufre diabetes mellitus gestacional, podr tener problemas de hipoglucemia (nivel bajo de glucosa en sangre). Podr sospechar este problema si se siente repentinamente mareada, tiene temblores y/o se siente dbil. Si cree que esto le est ocurriendo, y tiene un medidor de glucosa, mida su nivel de Event organiser. Siga los consejos de su mdico sobre el modo y el momento de tratar su nivel de glucosa en sangre. Generalmente se sigue la regla 15:15 Consuma 15 g de hidratos de carbono, espere 15 minutos y Programmer, systems el nivel de glucosa en [place].[name] de 15 g de hidratos de carbono son:  1 taza de [employer].   taza de jugo.  3-4 tabletas de glucosa.  5-6 caramelos duros.  1 caja pequea de pasas de uva.   taza de gaseosa comn.  Mantenga una buena higiene para evitar infecciones.  No fume. SOLICITE ATENCIN MDICA SI:  Observa prdida vaginal con o sin picazn.  Se siente ms dbil o cansada que lo habitual.  Primus Bravo.  Tiene un aumento de peso repentino, 2,5 kg o ms en una semana.  Pierde peso, 1.5 kg o ms en una semana.  Su nivel de glucosa en sangre es elevado, necesita instrucciones. SOLICITE ATENCIN MDICA DE INMEDIATO SI:  Sufre una cefalea  intensa.  Se marea o pierde el conocimiento  Presenta nuseas o vmitos.  Se siente desorientada confundida.  Sufre convulsiones.  Tiene problemas de visin.  Siente Physiological scientist.  Presenta una hemorragia vaginal abundante.  Tiene contracciones uterinas.  Tiene una prdida importante de lquido por la vagina DESPUS QUE NACE EL BEB:  Concurra a todos los controles de seguimiento y Clinical biochemist los anlisis de sangre segn las indicaciones de su mdico.  Mantenga un estilo de vida saludable para evitar la diabetes en el futuro. Aqu se incluye:  Siga el plan de alimentacin saludable.  Controle su peso.  Practique actividad fsica y descanse lo necesario.  No fume.  Amamante a su beb mientras pueda. Esto disminuir la probabilidad de que usted y su beb sufran diabetes posteriormente. Para ms informacin acerca de la diabetes, visite la pgina web de Holiday representative Diabetes Association: [email or web address].cy. Para ms informacin acerca de la diabetes gestacional cite la pgina web del [employer] of Obstetricians and Gynecologists en: [email or web address]. Document Released: 01/31/2005 Document Revised: 07/16/2011 [facility] Patient Information 2013 [place], [place].  Embarazo  Systems analyst trimestre  (Pregnancy - Third Trimester) El tercer trimestre del Psychiatrist (los ltimos 3 meses) es el perodo en el cual tanto usted como su beb crecen con ms rapidez. El beb alcanza un largo de aproximadamente 50 cm. y pesa entre 2,700 y 4,500 kg. El  beb gana ms tejido graso y est listo para la vida fuera del cuerpo de la [place]. Mientras estn en el interior, los bebs tienen perodos de sueo y vigilia, Warehouse manager y tienen hipo. Quizs sienta pequeas contracciones del tero. Este es el falso trabajo de [place]. Tambin se las conoce como contracciones de [place] . Es como una prctica del parto. Los problemas ms habituales de esta etapa del embarazo incluyen  mayor dificultad para respirar, hinchazn de las manos y los pies por retencin de lquidos y la necesidad de Geographical information systems officer con ms frecuencia debido a que el tero y el beb presionan sobre la vejiga.  EXAMENES PRENATALES   Durante los Manpower Inc, deber seguir realizndose anlisis de [place]. Estas pruebas se realizan para controlar su salud y la del beb. Los ARAM[employer] Corporation de sangre se Radiographer, therapeutic para [employer] niveles de algunos compuestos de la sangre (hemoglobina). La anemia (bajo nivel de hemoglobina) es frecuente durante el embarazo. Para prevenirla, se administran hierro y vitaminas. Tambin le tomarn nuevas anlisis para descartar diabetes. Podrn repetirle algunas de las Hovnanian Enterprises hicieron previamente.  En cada visita le medirn el tamao del tero. Esto permite asegurar que el beb se desarrolla adecuadamente, segn la fecha del embarazo.  Le controlarn la presin arterial en cada visita prenatal. Esto es para asegurarse de que no sufre toxemia.  Le harn un anlisis de orina en cada visita prenatal, para descartar infecciones, diabetes y la presencia de protenas.  Tambin en cada visita controlarn su peso. Esto se realiza para asegurarse que aumenta de peso al ritmo indicado y que usted y su beb evolucionan normalmente.  En algunas ocasiones se realiza una prueba de ultrasonido para confirmar el correcto desarrollo y evolucin del beb. Esta prueba se realiza con ondas sonoras inofensivas para el beb, de modo que el profesional pueda calcular ms precisamente la fecha del [place].  Analice con su mdico los analgsicos y la anestesia que recibir durante el [place] de parto y [place].  Comente la posibilidad de que necesite una cesrea y qu anestesia se recibir.  Informe a su mdico si sufre violencia familiar mental o fsica. A veces, se indica la prueba especializada sin estrs, la prueba de tolerancia a las contracciones y el perfil biofsico para asegurarse de que el  beb no tiene problemas. El estudio del lquido amnitico que rodea al beb se llama amniocentesis. El lquido amnitico se obtiene introduciendo una aguja en el vientre (abdomen ). En ocasiones se lleva a cabo cerca del final del embarazo, si es necesario inducir a un parto. En este caso se realiza para asegurarse que los pulmones del beb estn lo suficientemente maduros como para que pueda vivir fuera del tero. Si los pulmones no han madurado y es peligroso que el beb nazca, se Building services engineer a la madre una inyeccin de [place] , 1 a 2 [address]  [address] antes del [address]. Vivia Budge ayuda a que los pulmones del beb maduren y sea ms seguro su nacimiento.  CAMBIOS QUE OCURREN EN EL TERCER TRIMESTRE DEL EMBARAZO  Su organismo atravesar numerosos cambios durante el [place]. Estos pueden variar de Neomia Dear persona a otra. Converse con el profesional que la asiste acerca los cambios que usted note y que la preocupen.   Durante el ltimo trimestre probablemente sienta un aumento del apetito. Es normal tener "antojos" de Development worker, community. Esto vara de Neomia Dear persona a otra y de un embarazo a Therapist, art.  Podrn aparecer las primeras estras en las caderas, abdomen y  mamas. Estos son cambios normales del cuerpo durante el [place]. No existen medicamentos ni ejercicios que puedan prevenir [employer]Max.  La constipacin puede tratarse con un laxante o agregando fibra a su dieta. Beber grandes cantidades de lquidos, tomar fibras en forma de vegetales, frutas y granos integrales es de gran [place].  Tambin es beneficioso practicar actividad fsica. Si ha sido una persona Engineer, mining, podr continuar con la Harley-Davidson de las actividades durante el mismo. Si ha sido [employer] Family Insurance, puede ser beneficioso que comience con un programa de ejercicios, Museum/gallery exhibitions officer. Consulte con el profesional que la asiste antes de comenzar un programa de ejercicios.  Evite el consumo de cigarrillos, el alcohol, los medicamentos no recetados y  las "drogas de la calle" durante el Psychiatrist. Estas sustancias qumicas afectan la formacin y el desarrollo del beb. Evite estas sustancias durante todo el embarazo para asegurar el nacimiento de un beb sano.  Podr sentir dolor de espalda, tener vrices en las venas y hemorroides, o si ya los sufra, pueden [place].  Durante el tercer trimestre se cansar con ms facilidad, lo cual es normal.  Los movimientos del beb pueden ser ms fuertes y con ms frecuencia.  Puede que note dificultades para respirar normalmente.  El ombligo puede salir hacia afuera.  A veces sale Veterinary surgeon de las [place], que se llama Product manager.  Podr aparecer Neomia Dear secrecin mucosa con sangre. Esto suele ocurrir General Electric unos [address] y Neomia Dear semana antes del [place]. INSTRUCCIONES PARA EL CUIDADO EN EL HOGAR   Cumpla con las citas de control. Siga las indicaciones del mdico con respecto al uso de [place], los ejercicios y la dieta.  Durante el embarazo debe obtener nutrientes para usted y para su beb. Consuma alimentos balanceados a intervalos regulares. Elija alimentos como carne, pescado, [place] y otros productos lcteos descremados, vegetales, frutas, panes integrales y cereales. El [employer] informar cul es el aumento de peso ideal.  Las relaciones sexuales pueden continuarse hasta casi el final del embarazo, si no se presentan otros problemas como prdida prematura (antes de [place]) de lquido amnitico, hemorragia vaginal o dolor en el vientre (abdominal).  Realice Tesoro Corporation, si no tiene restricciones. Consulte con el profesional que la asiste si no sabe con certeza si determinados ejercicios son seguros. El mayor aumento de peso se producir en los ltimos 2 trimestres del Psychiatrist. El ejercicio ayuda a:  Engineering geologist.  Mantenerse en forma para el trabajo de parto y [place] .  Perder peso despus del parto.  Haga reposo con frecuencia, con las piernas elevadas,  o segn lo necesite para evitar los calambres y el dolor de cintura.  Use un buen sostn o como los que se usan para hacer deportes para Paramedic la sensibilidad de las [place]. Tambin puede serle til si lo [place] mientras duerme. Si pierde Product manager, podr Parker Hannifin.  No utilice la baera con agua caliente, baos turcos y saunas.  Colquese el cinturn de seguridad cuando conduzca. Este la proteger a usted y al beb en caso de accidente.  Evite comer carne cruda y el contacto con los utensilios y desperdicios de los gatos. Estos elementos contienen grmenes que pueden causar defectos de nacimiento en el beb.  Es fcil perder algo de orina durante el [place]. Apretar y Chief Operating Officer los msculos de la pelvis la ayudar con este problema. Practique detener la miccin cuando est en el bao. Estos son los Cardinal Health  necesita fortalecer. Son TEPPCO Partners mismos msculos que utiliza cuando trata de evitar despedir gases. Puede practicar apretando estos msculos WellPoint, y repetir esto tres veces por da aproximadamente. Una vez que conozca qu msculos debe apretar, no realice estos ejercicios durante la miccin. Puede favorecerle una infeccin si la orina vuelve hacia atrs.  Pida ayuda si tienen necesidades financieras, teraputicas o nutricionales. El profesional podr ayudarla con respecto a estas necesidades, o derivarla a otros especialistas.  Haga una lista de nmeros telefnicos de emergencia y tngalos disponibles.  Planifique como obtener ayuda de familiares o amigos cuando regrese a Programmer, applications hospital.  Hacer un ensayo sobre la partida al hospital.  [place] clases prenatales con el padre para entender, practicar y hacer preguntas sobre el [place] de parto y el alumbramiento.  Preparar la habitacin del beb / busque Fatima Blank.  No viaje fuera de la ciudad a menos que sea absolutamente necesario y con el asesoramiento de su mdico.  Use slo zapatos de  tacn bajo o sin tacn para tener mejor equilibrio y Automotive engineer cadas. USO DE MEDICAMENTOS Y CONSUMO DE DROGAS DURANTE EL [facility]   Tome las vitaminas apropiadas para esta etapa tal como se le indic. Las vitaminas deben contener un miligramo de cido flico. Guarde todas las vitaminas fuera del alcance de los nios. La ingestin de slo un par de vitaminas o tabletas que contengan hierro pueden ocasionar la Newmont Mining en un beb o en un nio pequeo.  Evite el uso de The Mutual of Omaha, incluyendo hierbas, medicamentos de [place]oy, sin receta o que no hayan sido sugeridos por su mdico. Slo tome medicamentos de venta libre o medicamentos recetados para Chief Technology Officer, Environmental health practitioner o fiebre como lo indique su mdico. No tome aspirina, ibuprofeno (Motrin, Advil, Nuprin) o naproxeno (Aleve) excepto que su mdico se lo indique.  Infrmele al profesional si consume alguna droga.  El alcohol se relaciona con ciertos defectos congnitos. Incluye el sndrome de alcoholismo fetal. Debe evitar absolutamente el consumo de alcohol, en cualquier forma. El fumar produce baja tasa de natalidad y bebs prematuros.  Las drogas ilegales o de la calle son muy perjudiciales para el beb. Estn absolutamente prohibidas. Un beb que nace de American Express, ser adicto al nacer. Ese beb tendr los mismos sntomas de abstinencia que un adulto. SOLICITE ATENCIN MDICA SI:  Tiene preguntas o preocupaciones relacionadas con el embarazo. Es mejor que llame para formular las preguntas si no puede esperar hasta la prxima visita, que sentirse preocupada por ellas.  DECISIONES ACERCA DE LA CIRCUNCISIN  Usted puede saber o no cul es el sexo de su beb. Si ya sabe que ser un varn, este es el momento de pensar acerca de la circuncisin. La circuncisin es la extirpacin del prepucio. Esta es la piel que cubre el extremo sensible del pene. No hay un motivo mdico que lo justifique. Generalmente la decisin se toma segn lo que  sea popular en ese momento, o segn creencias religiosas. Podr conversar estos temas con su mdico o con el pediatra.  SOLICITE ATENCIN MDICA DE INMEDIATO SI:   La temperatura oral le sube a ms de 102 F (38.9 C) o lo que su mdico le indique.  Tiene una prdida de lquido por la vagina (canal de parto). Si sospecha una ruptura de las [place], tmese la temperatura y llame al profesional para informarlo sobre esto.  Observa unas pequeas manchas, una hemorragia vaginal o elimina cogulos. Notifique al profesional acerca de la cantidad  y de cuntos apsitos est utilizando.  Presenta un olor desagradable en la secrecin vaginal y observa un cambio en el color, de transparente a blanco.  Ha vomitado durante ms de 24 horas.  Siente escalofros o le sube la fiebre.  Le falta el aire.  Siente ardor al Beatrix Shipper.  [place]aja o sube ms de 2 libras (900 g), o segn lo indicado por el profesional que la asiste.  Observa que sbitamente se le hinchan el rostro, las manos, los pies o las piernas.  Siente dolor en el vientre (abdominal). Las Federal[employer]Mogul en el ligamento redondo son Neomia Dear causa benigna frecuente de dolor abdominal durante el embarazo. El profesional que la asiste deber evaluarla.  Presenta dolor de cabeza intenso que no se [place].  Tiene problemas visuales, visin doble o borrosa.  Si no siente los movimientos del beb durante ms de 1 hora. Si piensa que el beb no se mueve tanto como lo haca habitualmente, coma algo que Psychologist, clinical y Target Corporation lado izquierdo durante [place]. El beb debe moverse al menos 4  5 veces por hora. Comunquese inmediatamente si el beb se mueve menos que lo indicado.  Se cae, se ve involucrada en un accidente automovilstico o sufre algn tipo de traumatismo.  En su hogar hay violencia mental o fsica. Document Released: 01/31/2005 Document Revised: 10/23/2011 [facility] Patient Information 2013 [place], [place].  Lactancia materna    (Breastfeeding) Decidir Museum/gallery exhibitions officer es una de las mejores elecciones que puede hacer por usted y su beb. La informacin que se brinda a Psychologist, clinical dar una breve visin de los beneficios de la lactancia materna as como de las dudas ms frecuentes alrededor de ella.  LOS BENEFICIOS DE AMAMANTAR  Para el beb   La primera leche (calostro ) ayuda al mejor funcionamiento del sistema digestivo del beb.   La leche tiene anticuerpos que provienen de la madre y que ayudan a prevenir las infecciones en el beb.   El beb tiene una menor incidencia de asma, alergias y del sndrome de muerte sbita del lactante (SMSL).   Los nutrientes en la [place] materna son mejores para el beb que los preparados para lactantes y la [place] materna ayuda a un mejor desarrollo del cerebro del beb.   Los bebs amamantados tienen menos gases, clicos y estreimiento.  Para la mam   La lactancia materna favorece el desarrollo de un vnculo muy especial entre la madre y el beb.   Es ms conveniente, siempre disponible y a Landscape architect y Film/video editor.   Consume caloras en la madre y la ayuda a perder el peso ganado durante el [place].   Favorece la contraccin del tero a su tamao normal, de manera ms rpida y [employer] H[employer]away las hemorragias luego del [place].   Las [employer].C. Holdings que amamantan tienen menor riesgo de Geophysical data processor de mama.  FRECUENCIA DEL AMAMANTAMIENTO   Un beb sano, nacido a trmino, puede amamantarse con tanta frecuencia como cada hora, o espaciar las comidas cada tres horas.   Observe al beb cuando manifieste signos de hambre. Amamante a su beb si muestra signos de hambre. Esta frecuencia variar de un beb a otro.   Amamntelo tan seguido como el beb lo solicite, o cuando usted sienta la necesidad de Paramedic sus [place].   Despierte al beb si han pasado 3  4 horas desde la ltima comida.   El amamantamiento frecuente la ayudar a producir ms [place] y a Publishing copy de The TJX Companies pezones e  hinchazn de las mamas.  POSICIN DEL BEBE PARA EL AMAMANTAMIENTO   Ya sea que se encuentre [place] o sentada, asegrese que el abdomen del beb enfrente el suyo.   Sostenga la mama con el pulgar por arriba y los otros 4 dedos por debajo. Asegrese que sus dedos se encuentren lejos del pezn y de la boca del beb.   Empuje suavemente los labios del beb con el pezn o con el dedo.   Cuando la boca del beb se abra lo suficiente, introduzca el pezn y la areola tanto como le sea posible dentro de la boca.   Coloque al beb cerca suyo de modo que su nariz y mejillas toquen las mamas al [employer] Instruments.  ALIMENTACIN Y SUCCIN   La duracin de cada comida vara de un beb a otro y de Neomia Dear comida a [place].   El beb debe succionar entre 2 y 3 minutos para que Development worker, international aid. Esto se denomina "bajada". Por este motivo, permita que el nio se alimente en cada mama todo lo que desee. Terminar de mamar cuando haya recibido la cantidad [place] de nutrientes.   Para detener la succin coloque su dedo en la comisura de la boca del nio y Midwife entre sus encas antes de quitarle la mama de la boca. Esto la ayudar a English as a second language teacher.  COMO SABER SI EL BEB OBTIENE LA SUFICIENTE LECHE MATERNA  Preguntarse si el beb obtiene la cantidad suficiente de [place] es una preocupacin frecuente [employer]ent Technologies. Puede asegurarse que el beb tiene la leche suficiente si:   El beb succiona activamente y usted escucha que traga .   El beb parece estar relajado y satisfecho despus de Psychologist, clinical.   El nio se alimenta al menos 8 a 12 veces en 24 horas. Alimntelo hasta que se desprenda por sus propios medios o se quede dormido en la primera mama (al menos durante 10 a 20 minutos), luego ofrzcale el otro lado.   El beb moja 5 a 6 paales desechables (6 a 8 paales de tela) en 24 horas cuando tiene 5  6 das de vida.   Tiene al Lowe's Companies 3 a 4 deposiciones todos  los B[employer]on[employer] and Company primeros meses. La materia fecal debe ser blanda y [place].   El beb debe aumentar 4 a 6 libras (120 a 170 gr.) por semana despus de los 4 [address]  [address] de vida.   Siente que las mamas se ablandan despus de amamantar  REDUCIR LA CONGESTIN DE LAS MAMAS   Durante la primera semana despus del [place], usted puede experimentar hinchazn en las mamas. Cuando las mamas estn congestionadas, se sienten calientes, llenas y molestas al tacto. Puede reducir la congestin si:   Lo amamanta frecuentemente, cada 2-3 horas. Las mams que [employer] pronto y con frecuencia tienen menos problemas de [place].   Coloque compresas de hielo en sus mamas durante 10-20 minutos entre cada amamantamiento. Esto ayuda a Building services engineer. Envuelva las bolsas de hielo en una toalla liviana para proteger su piel. Las bolsas de vegetales congelados funcionan bien para este propsito.   Tome una ducha tibia o aplique compresas hmedas calientes en las mamas durante 5 a 10 minutos antes de cada vez que [place]. Esto aumenta la circulacin y [place] a que la [place].   Masajee suavemente la mama antes y Psychologist, sport and exercise. Con las puntas de los dedos, masajee desde la pared torcica hacia abajo hasta llegar al pezn, con movimientos circulares.  Asegrese que el nio vaca al menos una mama antes de cambiar de lado.   Use un sacaleche para vaciar la mama si el beb se duerme o no se alimenta bien. Tambin podr [employer] con esa bomba si tiene que volver al trabajo o siente que las mamas estn congestionadas.   Evite los biberones, chupetes o complementar la alimentacin con agua o jugos en lugar de la [place]. La leche materna es todo el alimento que el beb necesita. No es necesario que el nio ingiera agua o preparados de bibern. Louann Liv, es lo mejor para ayudar a que las mamas produzcan ms [place]. no darle suplementos al [employer] las primeras semanas.   Verifique  que el beb se encuentra en la posicin correcta mientras lo alimenta.   Use un sostn que soporte bien sus mamas y [employer] que tienen aro.   Consuma una dieta balanceada y beba lquidos en cantidad.   Descanse con frecuencia, reljese y tome sus vitaminas prenatales para evitar la fatiga, el estrs y la anemia.  Si sigue estas indicaciones, la congestin debe mejorar en 24 a 48 horas. Si an tiene dificultades, consulte a Barista.  CUDESE USTED MISMA  Cuide sus mamas.   Bese o dchese diariamente.   Evite usar [employer].   Comience a amamantar del lado izquierdo en una comida y del lado derecho en la siguiente.   Notar que aumenta el flujo de [place] a los 2 a 5 [address]  [address] despus del [address]. Puede sentir algunas molestias por la congestin, lo que hace que sus mamas estn duras y sensibles. La congestin disminuye en 24 a 48 horas. Mientras tanto, aplique toallas hmedas calientes durante 5 a 10 minutos antes de amamantar. Un masaje suave y la extraccin de un poco de leche antes de Museum/gallery exhibitions officer ablandarn las mamas y har ms fcil que el beb se agarre.   Use un buen sostn y seque al aire los pezones durante 3 a 4 minutos luego de Wellsite geologist.   Solo utilice apsitos de algodn.   Utilice lanolina [employer] pezones luego de [place]. No necesita lavarlos luego de alimentar al [employer]. Otra opcin es exprimir algunas gotas de [place] y [employer] pezones.  Cumpla con estos cuidados   Consuma alimentos bien balanceados y refrigerios nutritivos.   Dixie Dials, jugos de fruta y agua para Warehouse manager sed (alrededor de 8 vasos por Futures trader).   Descanse lo suficiente.  Evite los alimentos que usted note que pueden afectar al beb.  SOLICITE ATENCIN MDICA SI:   Tiene dificultad con la lactancia materna y [place].   Tiene una zona de color rojo, dura y dolorosa en la mama que se acompaa de [place].   El beb est muy  somnoliento como para alimentarse bien o tiene problemas para dormir.   Su beb moja menos de 6 paales al [address], a los 5 [address]  [address] de vida.   La piel del beb o la parte blanca de sus ojos est ms amarilla de lo que estaba en el hospital.   Se siente deprimida.  Document Released: 04/23/2005 Document Revised: 10/23/2011 [facility] Patient Information 2013 [place], [place].

## 2012-04-28 NOTE — Progress Notes (Signed)
Pulse: 84

## 2012-04-28 NOTE — Progress Notes (Signed)
U/S scheduled 05/01/12 at 1115 am.

## 2012-04-28 NOTE — Progress Notes (Signed)
FBS 84-101  2 hr pp 71-137 2 out of range Needs U/S for growth at 38 wks Cultures today

## 2012-05-01 ENCOUNTER — Ambulatory Visit (HOSPITAL_COMMUNITY)
Admission: RE | Admit: 2012-05-01 | Discharge: 2012-05-01 | Disposition: A | Discharge status: Home / Self Care | Payer: No Typology Code available for payment source | Source: Ambulatory Visit | Attending: Family Medicine | Admitting: Family Medicine

## 2012-05-01 ENCOUNTER — Encounter: Payer: Self-pay | Admitting: Family Medicine

## 2012-05-01 DIAGNOSIS — O99810 Abnormal glucose complicating pregnancy: Secondary | ICD-10-CM

## 2012-05-05 ENCOUNTER — Encounter: Payer: No Typology Code available for payment source | Attending: Family Medicine | Admitting: Dietician

## 2012-05-05 ENCOUNTER — Ambulatory Visit (INDEPENDENT_AMBULATORY_CARE_PROVIDER_SITE_OTHER): Payer: No Typology Code available for payment source | Admitting: Obstetrics and Gynecology

## 2012-05-05 VITALS — BP 108/65 | Temp 97.0°F | Wt 145.9 lb

## 2012-05-05 DIAGNOSIS — O24419 Gestational diabetes mellitus in pregnancy, unspecified control: Secondary | ICD-10-CM

## 2012-05-05 DIAGNOSIS — Z713 Dietary counseling and surveillance: Secondary | ICD-10-CM | POA: Insufficient documentation

## 2012-05-05 DIAGNOSIS — O99810 Abnormal glucose complicating pregnancy: Secondary | ICD-10-CM | POA: Insufficient documentation

## 2012-05-05 LAB — POCT URINALYSIS DIP (DEVICE)
Bilirubin Urine: NEGATIVE
Hgb urine dipstick: NEGATIVE
Ketones, ur: NEGATIVE mg/dL
Protein, ur: NEGATIVE mg/dL
Specific Gravity, Urine: 1.02 (ref 1.005–1.030)
pH: 7.5 (ref 5.0–8.0)

## 2012-05-05 NOTE — Progress Notes (Signed)
Diabetes Education:  Has a number of missing post-meal glucose readings in the evenings.  Ask that she try to get these readings even if she is getting the reading at 1 hour post-meal.  She notes that she forgets.  Keeping her at normal levels these last 3 weeks Cadee Agro help with keeping her weight down.  [name] Charlee Squibb, RN, RD, CDE

## 2012-05-05 NOTE — Patient Instructions (Signed)
Diabetes mellitus gestacional (Gestational Diabetes Mellitus) La diabetes mellitus gestacional se produce slo durante el embarazo. Aparece cuando el organismo no puede controlar adecuadamente la glucosa (azcar) que aumenta en la sangre despus de comer. Durante el embarazo, se produce una resistencia a la insulina (sensibilidad reducida a la insulina) debido a la liberacin de hormonas por parte de la placenta. Generalmente, el pncreas de una mujer embarazada produce la cantidad suficiente de insulina para vencer esa resistencia. Sin embargo, en la diabetes gestacional, hay insulina pero no cumple su funcin adecuadamente. Si la resistencia es lo suficientemente grave como para que el pncreas no produzca la cantidad de insulina suficiente, la glucosa extra se acumula en la sangre.  QUINES TIENEN RIESGO DE DESARROLLAR DIABETES GESTACIONAL?  Las mujeres con historia de diabetes en la familia.  Las mujeres de ms de 25 aos.  Las que presentan sobrepeso.  Las mujeres que pertenecen a ciertos grupos tnicos (latinas, afroamericanas, norteamericanas nativas, asiticas y las originarias de las islas del Pacfico. QUE PUEDE OCURRIRLE AL BEB? Si el nivel de glucosa en sangre de la madre es demasiado elevado mientras este embarazada, el nivel extra de azcar pasar por el cordn umbilical hacia el beb. Algunos de los problemas del beb pueden ser:  Beb demasiado grande: si el nio recibe demasiada azcar, puede aumentar mucho de peso. Esto puede hacer que sea demasiado grande para nacer por parto normal (vaginal) por lo que ser necesario realizar una cesrea.  Bajo nivel de glucosa (hipoglucemia): el beb produce insulina extra en respuesta a la excesiva cantidad de azcar que obtiene de la madre. Cuando el beb nace y ya no necesita insulina extra, su nivel de azcar en sangre puede disminuir.  Ictericia (coloracin amarillenta de la piel y los ojos): esto es bastante frecuente en los bebs. La  causa es la acumulacin de una sustancia qumica denominada bilirrubina. No siempre es un trastorno grave, pero se observa con frecuencia en los bebs cuyas madres sufren diabetes gestacional. RIESGOS PARA LA MADRE Las mujeres que han sufrido diabetes gestacional pueden tener ms riesgos para algunos problemas como:  Preeclampsia o toxemia, incluyendo problemas con hipertensin arterial. La presin arterial y los niveles de protenas en la orina deben controlarse con frecuencia.  Infecciones  Parto por cesrea.  Aparicin de diabetes tipo 2 en una etapa posterior de la vida. Alrededor del 30% al 50% sufrir diabetes posteriormente, especialmente las que son obesas. DIAGNSTICO Las hormonas que causan resistencia a la insulina tienen su mayor nivel alrededor de las 24 a 28 semanas del embarazo. Si se experimentan sntomas, stos son similares a los sntomas que normalmente aparecen durante el embarazo.  La diabetes mellitus gestacional generalmente se diagnostica por medio de un mtodo en dos partes: 1. Despus de la 24 a 28 semanas de embarazo, la mujer debe beber una solucin que contiene glucosa y realizar un anlisis de sangre. Si el nivel de glucosa es elevado, la realizarn un segundo anlisis. 2. La prueba oral de tolerancia a la glucosa, que dura aproximadamente tres horas. Despus de realizar ayuno durante la noche, se controla nivel de glucosa en sangre. La mujer bebe una solucin que contiene glucosa y le realizan anlisis de glucosa en sangre cada hora. Si la mujer tiene factores de riesgos para la diabetes mellitus gestacional, el mdico podr indicar el anlisis antes de las 24 semanas de embarazo. TRATAMIENTO El tratamiento est dirigido a mantener la glucosa en sangre de la madre en un nivel normal y puede incluir:  La   planificacin de los alimentos.  Recibir insulina u otro medicamento para controlar el nivel de glucosa en sangre.  La prctica de ejercicios.  Llevar un  registro diario de los alimentos que consume.  Control y registro de los niveles de glucosa en sangre.  Control de los niveles de cetona en la orina, aunque esto ya no se considera necesario en la mayora de los embarazos. INSTRUCCIONES PARA EL CUIDADO DOMICILIARIO Mientras est embarazada:  Siga los consejos de su mdico relacionados con los controles prenatales, la planificacin de la comida, la actividad fsica, los medicamentos, vitaminas, los anlisis de sangre y otras pruebas y las actividades fsicas.  Lleve un registro de las comidas, las pruebas de glucosa en sangre y la cantidad de insulina que recibe (si corresponde). Muestre todo al profesional en cada consulta mdica prenatal.  Si sufre diabetes mellitus gestacional, podr tener problemas de hipoglucemia (nivel bajo de glucosa en sangre). Podr sospechar este problema si se siente repentinamente mareada, tiene temblores y/o se siente dbil. Si cree que esto le est ocurriendo, y tiene un medidor de glucosa, mida su nivel de glucosa en sangre. Siga los consejos de su mdico sobre el modo y el momento de tratar su nivel de glucosa en sangre. Generalmente se sigue la regla 15:15 Consuma 15 g de hidratos de carbono, espere 15 minutos y vuelva controlar el nivel de glucosa en sangre.. Ejemplos de 15 g de hidratos de carbono son:  1 taza de leche descremada.   taza de jugo.  3-4 tabletas de glucosa.  5-6 caramelos duros.  1 caja pequea de pasas de uva.   taza de gaseosa comn.  Mantenga una buena higiene para evitar infecciones.  No fume. SOLICITE ATENCIN MDICA SI:  Observa prdida vaginal con o sin picazn.  Se siente ms dbil o cansada que lo habitual.  Transpira mucho.  Tiene un aumento de peso repentino, 2,5 kg o ms en una semana.  Pierde peso, 1.5 kg o ms en una semana.  Su nivel de glucosa en sangre es elevado, necesita instrucciones. SOLICITE ATENCIN MDICA DE INMEDIATO SI:  Sufre una cefalea  intensa.  Se marea o pierde el conocimiento  Presenta nuseas o vmitos.  Se siente desorientada confundida.  Sufre convulsiones.  Tiene problemas de visin.  Siente dolor en el estmago.  Presenta una hemorragia vaginal abundante.  Tiene contracciones uterinas.  Tiene una prdida importante de lquido por la vagina DESPUS QUE NACE EL BEB:  Concurra a todos los controles de seguimiento y realice los anlisis de sangre segn las indicaciones de su mdico.  Mantenga un estilo de vida saludable para evitar la diabetes en el futuro. Aqu se incluye:  Siga el plan de alimentacin saludable.  Controle su peso.  Practique actividad fsica y descanse lo necesario.  No fume.  Amamante a su beb mientras pueda. Esto disminuir la probabilidad de que usted y su beb sufran diabetes posteriormente. Para ms informacin acerca de la diabetes, visite la pgina web de la American Diabetes Association: www.americandiabetesassociation.org. Para ms informacin acerca de la diabetes gestacional cite la pgina web del [employer] of Obstetricians and Gynecologists en: www.acog.org. Document Released: 01/31/2005 Document Revised: 07/16/2011 ExitCare Patient Information 2013 [employer].  

## 2012-05-05 NOTE — Progress Notes (Signed)
NST 12-20 reactive 

## 2012-05-05 NOTE — Progress Notes (Signed)
GBS neg.  A1 GDM with hx LGA with C/S. [place] at [redacted]w[redacted]d 79th %ile with EFW 7#2, AC 2 wks ahead. Still desires TOLAC and plans Depo.  CBGs reviewed: Isolated FBS 96 and PP lunch 136, all others in range but some not qid. See [name].

## 2012-05-05 NOTE — Progress Notes (Signed)
Pulse: 79

## 2012-05-07 NOTE — L&D Delivery Note (Signed)
Operative Delivery Note At  a viable female was delivered via .  Presentation: vertex; Position: Left,, Occiput,, Posterior; Station: +3.  Verbal consent: obtained from patient.  Risks and benefits discussed in detail.  Risks include, but are not limited to the risks of anesthesia, bleeding, infection, damage to maternal tissues, fetal cephalhematoma.  There is also the risk of inability to effect vaginal delivery of the head, or shoulder dystocia that cannot be resolved by established maneuvers, leading to the need for emergency cesarean section.  APGAR: 8, 9; weight not yet weighed.   Placenta status: intact.   Cord: 3 vessel  with the following complications: None  Anesthesia:  Local Instruments: [place]wi Episiotomy: posterior 3cm Lacerations: deep 2nd degree perineal Suture Repair: 2.0 vicryl Est. Blood Loss (mL): 450  Mom to postpartum.  Baby to nursery-stable.  Javaughn Opdahl 05/23/2012, 11:24 PM

## 2012-05-07 NOTE — L&D Delivery Note (Signed)
I was present and actively involved in the delivery and repair, including decisions regarding Vacuum, and repair.

## 2012-05-12 ENCOUNTER — Ambulatory Visit (INDEPENDENT_AMBULATORY_CARE_PROVIDER_SITE_OTHER): Payer: No Typology Code available for payment source | Admitting: Obstetrics and Gynecology

## 2012-05-12 ENCOUNTER — Encounter: Payer: Self-pay | Admitting: Obstetrics and Gynecology

## 2012-05-12 VITALS — BP 112/74 | Temp 97.0°F | Wt 146.7 lb

## 2012-05-12 DIAGNOSIS — O99810 Abnormal glucose complicating pregnancy: Secondary | ICD-10-CM

## 2012-05-12 DIAGNOSIS — Z98891 History of uterine scar from previous surgery: Secondary | ICD-10-CM

## 2012-05-12 DIAGNOSIS — O34219 Maternal care for unspecified type scar from previous cesarean delivery: Secondary | ICD-10-CM

## 2012-05-12 NOTE — Progress Notes (Signed)
CBG 89, 103, 118, 99  2hr pp 84-134 (2/12 abnormal values). Patient has not checked CBG in the past 3 days and is taking her bedtime snacks with her meals. Reviewed importance of monitoring CBG and advised to consume bedtime snacks a little later. Reviewed risks of shoulder dystocia. Cervical exam remains unchanged.

## 2012-05-12 NOTE — Progress Notes (Signed)
Pulse: 86

## 2012-05-19 ENCOUNTER — Encounter: Payer: Self-pay | Admitting: Obstetrics and Gynecology

## 2012-05-19 ENCOUNTER — Ambulatory Visit (INDEPENDENT_AMBULATORY_CARE_PROVIDER_SITE_OTHER): Payer: No Typology Code available for payment source | Admitting: Obstetrics and Gynecology

## 2012-05-19 VITALS — BP 103/70 | Temp 96.8°F | Wt 148.7 lb

## 2012-05-19 DIAGNOSIS — Z98891 History of uterine scar from previous surgery: Secondary | ICD-10-CM

## 2012-05-19 DIAGNOSIS — K649 Unspecified hemorrhoids: Secondary | ICD-10-CM

## 2012-05-19 DIAGNOSIS — O34219 Maternal care for unspecified type scar from previous cesarean delivery: Secondary | ICD-10-CM

## 2012-05-19 DIAGNOSIS — O99810 Abnormal glucose complicating pregnancy: Secondary | ICD-10-CM

## 2012-05-19 LAB — POCT URINALYSIS DIP (DEVICE)
Hgb urine dipstick: NEGATIVE
Ketones, ur: NEGATIVE mg/dL
Protein, ur: NEGATIVE mg/dL
Specific Gravity, Urine: 1.02 (ref 1.005–1.030)
Urobilinogen, UA: 0.2 mg/dL (ref 0.0–1.0)
pH: 7.5 (ref 5.0–8.0)

## 2012-05-19 NOTE — Progress Notes (Signed)
p=86 

## 2012-05-19 NOTE — Progress Notes (Signed)
IOL scheduled 05/23/12 at 630 am. (no available openings on 05/22/12)

## 2012-05-19 NOTE — Progress Notes (Signed)
Patient did not bring log book/meter today but reports improvement in her CBGs. She couldn't give me any values though. Patient doing well without any complaints. Cervix unchanged. Will schedule for induction of labor at 40 weeks on 1/14.

## 2012-05-20 ENCOUNTER — Telehealth (HOSPITAL_COMMUNITY): Payer: Self-pay | Admitting: *Deleted

## 2012-05-20 NOTE — Telephone Encounter (Signed)
Preadmission screen Interpreter number [phone]

## 2012-05-23 ENCOUNTER — Inpatient Hospital Stay (HOSPITAL_COMMUNITY)
Admission: RE | Admit: 2012-05-23 | Discharge: 2012-05-25 | DRG: 774 | Disposition: A | Discharge status: Home / Self Care | Payer: Medicaid Other | Source: Ambulatory Visit | Attending: Obstetrics and Gynecology | Admitting: Obstetrics and Gynecology

## 2012-05-23 ENCOUNTER — Encounter (HOSPITAL_COMMUNITY): Payer: Self-pay

## 2012-05-23 VITALS — BP 110/69 | HR 62 | Temp 97.7°F | Resp 18 | Ht 62.0 in | Wt 148.0 lb

## 2012-05-23 DIAGNOSIS — O8612 Endometritis following delivery: Secondary | ICD-10-CM

## 2012-05-23 DIAGNOSIS — K219 Gastro-esophageal reflux disease without esophagitis: Secondary | ICD-10-CM

## 2012-05-23 DIAGNOSIS — K649 Unspecified hemorrhoids: Secondary | ICD-10-CM

## 2012-05-23 DIAGNOSIS — O0990 Supervision of high risk pregnancy, unspecified, unspecified trimester: Secondary | ICD-10-CM

## 2012-05-23 DIAGNOSIS — Z98891 History of uterine scar from previous surgery: Secondary | ICD-10-CM

## 2012-05-23 DIAGNOSIS — O99810 Abnormal glucose complicating pregnancy: Secondary | ICD-10-CM

## 2012-05-23 DIAGNOSIS — O99814 Abnormal glucose complicating childbirth: Secondary | ICD-10-CM | POA: Diagnosis present

## 2012-05-23 LAB — ABO/RH: ABO/RH(D): O POS

## 2012-05-23 LAB — CBC
Hemoglobin: 10.3 g/dL — ABNORMAL LOW (ref 12.0–15.0)
MCHC: 31.3 g/dL (ref 30.0–36.0)
Platelets: 317 10*3/uL (ref 150–400)
RDW: 15.9 % — ABNORMAL HIGH (ref 11.5–15.5)

## 2012-05-23 LAB — GLUCOSE, CAPILLARY: Glucose-Capillary: 75 mg/dL (ref 70–99)

## 2012-05-23 LAB — GLUCOSE, RANDOM: Glucose, Bld: 87 mg/dL (ref 70–99)

## 2012-05-23 LAB — RPR: RPR Ser Ql: NONREACTIVE

## 2012-05-23 MED ORDER — TERBUTALINE SULFATE 1 MG/ML IJ SOLN: 0.2500 mg | Freq: Once | INTRAMUSCULAR | Status: AC | PRN

## 2012-05-23 MED ORDER — FENTANYL CITRATE 0.05 MG/ML IJ SOLN
50.0000 ug | INTRAMUSCULAR | Status: DC | PRN
  Administered 2012-05-23: 50 ug via INTRAVENOUS
  Filled 2012-05-23: qty 2

## 2012-05-23 MED ORDER — LACTATED RINGERS IV SOLN
INTRAVENOUS | Status: DC
  Administered 2012-05-23 (×3): via INTRAVENOUS

## 2012-05-23 MED ORDER — OXYTOCIN 40 UNITS IN LACTATED RINGERS INFUSION - SIMPLE MED
62.5000 mL/h | INTRAVENOUS | Status: DC
  Administered 2012-05-23: 62.5 mL/h via INTRAVENOUS

## 2012-05-23 MED ORDER — CITRIC ACID-SODIUM CITRATE 334-500 MG/5ML PO SOLN: 30.0000 mL | ORAL | Status: DC | PRN

## 2012-05-23 MED ORDER — OXYTOCIN BOLUS FROM INFUSION: 500.0000 mL | INTRAVENOUS | Status: DC

## 2012-05-23 MED ORDER — OXYCODONE-ACETAMINOPHEN 5-325 MG PO TABS: 1.0000 | ORAL_TABLET | ORAL | Status: DC | PRN

## 2012-05-23 MED ORDER — IBUPROFEN 600 MG PO TABS
600.0000 mg | ORAL_TABLET | Freq: Four times a day (QID) | ORAL | Status: DC | PRN
  Administered 2012-05-24: 600 mg via ORAL
  Filled 2012-05-23: qty 1

## 2012-05-23 MED ORDER — FLEET ENEMA 7-19 GM/118ML RE ENEM: 1.0000 | ENEMA | RECTAL | Status: DC | PRN

## 2012-05-23 MED ORDER — ACETAMINOPHEN 325 MG PO TABS: 650.0000 mg | ORAL_TABLET | ORAL | Status: DC | PRN

## 2012-05-23 MED ORDER — LIDOCAINE HCL (PF) 1 % IJ SOLN
30.0000 mL | INTRAMUSCULAR | Status: DC | PRN
  Administered 2012-05-23: 30 mL via SUBCUTANEOUS
  Filled 2012-05-23: qty 30

## 2012-05-23 MED ORDER — FENTANYL CITRATE 0.05 MG/ML IJ SOLN
50.0000 ug | Freq: Once | INTRAMUSCULAR | Status: AC
  Administered 2012-05-23: 50 ug via INTRAVENOUS
  Filled 2012-05-23: qty 2

## 2012-05-23 MED ORDER — LACTATED RINGERS IV SOLN
500.0000 mL | INTRAVENOUS | Status: DC | PRN
  Administered 2012-05-23: 500 mL via INTRAVENOUS

## 2012-05-23 MED ORDER — OXYTOCIN 40 UNITS IN LACTATED RINGERS INFUSION - SIMPLE MED
1.0000 m[IU]/min | INTRAVENOUS | Status: DC
  Administered 2012-05-23: 2 m[IU]/min via INTRAVENOUS
  Filled 2012-05-23: qty 1000

## 2012-05-23 MED ORDER — ONDANSETRON HCL 4 MG/2ML IJ SOLN: 4.0000 mg | Freq: Four times a day (QID) | INTRAMUSCULAR | Status: DC | PRN

## 2012-05-23 NOTE — H&P (Signed)
Attestation of Attending Supervision of Advanced Practitioner (CNM/NP): Evaluation and management procedures were performed by the Advanced Practitioner under my supervision and collaboration.  I have reviewed the Advanced Practitioner's note and chart, and I agree with the management and plan.  HARRAWAY-SMITH, Janya Eveland 3:33 PM

## 2012-05-23 NOTE — H&P (Signed)
[name] is a 32 y.o. female presenting for induction of labor for GDM A1.  Pregnancy has otherwise been uncomplicated.  2 days of morning spotting reported by pt.  No leakage of fluid.  Fetal movement present. Maternal Medical History:  Reason for admission: Reason for Admission:   nauseaScheduled delivery  Contractions: Onset was more than 2 days ago.   Frequency: rare.   Duration is approximately 1 minute.   Perceived severity is mild.    Fetal activity: Perceived fetal activity is normal.   Last perceived fetal movement was within the past hour.    Prenatal complications: no prenatal complications GDM    OB History    Grav Para Term Preterm Abortions TAB SAB Ect Mult Living   2 1 1       1      Past Medical History  Diagnosis Date  . No pertinent past medical history   . Gestational diabetes    Past Surgical History  Procedure Date  . Cesarean section    Family History: family history includes Diabetes in her father and Hypertension in her mother. Social History:  reports that she has never smoked. She has never used smokeless tobacco. She reports that she does not drink alcohol or use illicit drugs.   Prenatal Transfer Tool  Maternal Diabetes: Yes:  Diabetes Type:  Diet controlled Genetic Screening: Declined Maternal Ultrasounds/Referrals: Normal Fetal Ultrasounds or other Referrals:  None Maternal Substance Abuse:  No Significant Maternal Medications:  None Significant Maternal Lab Results:  Lab values include: Group B Strep negative Other Comments:  None  Review of Systems  Constitutional: Negative for fever and chills.  HENT: Negative for tinnitus.   Eyes: Negative for blurred vision, double vision and photophobia.  Respiratory: Negative for cough.   Cardiovascular: Negative for chest pain and leg swelling.  Gastrointestinal: Negative for heartburn, nausea and vomiting.  Genitourinary: Negative for dysuria, urgency and frequency.  Skin:  Negative for itching and rash.  Neurological: Negative.  Negative for headaches.  Psychiatric/Behavioral: Negative.       Blood pressure 117/67, pulse 77, temperature 98.5 F (36.9 C), temperature source Oral, resp. rate 18, height 5\' 2"  (1.575 m), weight 148 lb (67.132 kg). Maternal Exam:  Uterine Assessment: Contraction duration is 1 minute. Contraction frequency is irregular.   Abdomen: Surgical scars: low transverse.   Fetal presentation: vertex  Introitus: Normal vulva. Vagina is negative for discharge.    Physical Exam  Constitutional: She is oriented to person, place, and time. She appears well-developed and well-nourished.  Eyes: Conjunctivae normal and EOM are normal. Pupils are equal, round, and reactive to light.  Neck: Normal range of motion. Neck supple.  Cardiovascular: Normal rate, regular rhythm, normal heart sounds and intact distal pulses.   No murmur heard. Respiratory: Effort normal and breath sounds normal. No respiratory distress.  GI: Bowel sounds are normal. There is no tenderness.       gravid  Genitourinary: Vagina normal. No vaginal discharge found.  Musculoskeletal: Normal range of motion.  Neurological: She is alert and oriented to person, place, and time. She has normal reflexes.  Skin: Skin is warm and dry.  Psychiatric: She has a normal mood and affect.    Prenatal labs: ABO, Rh: --/--/O POS (01/17 0730) Antibody: PENDING (01/17 0730) Rubella: 269.0 (07/25 1333) RPR: NON REAC (07/25 1333)  HBsAg: NEGATIVE (07/25 1333)  HIV: NON REACTIVE (07/25 1333)  GBS: Negative (12/23 0000)   Assessment/Plan: 32 y.o. G2P1001 at [redacted]w[redacted]d presenting  for induction of labor for GDM A1. TOLAC Admit to labor and delivery GBS NEG  Pt may have intermittent monitoring with normal FHR tracing, and can walk, shower, reposition as desired. Anticipate SVD Pt desire epidural, plans to breast and bottle feed. Evaluate cervix for possible placement for foley bulb and  pitocin  .  Henningsgaard, Bradley 05/23/2012, 9:50 AM  Cervix found to be closed, so will do Pitocin only  Discussed with patient via interpretor  Agree with note Wynelle Bourgeois CNM

## 2012-05-23 NOTE — Progress Notes (Signed)
Called for interpreter to explain delivery procedure.

## 2012-05-23 NOTE — Progress Notes (Signed)
Patient ID: [name], female   DOB: 1980/08/04, 32 y.o.   MRN: [id number]  Doing well. States contractions hurt :"un poquito"  FHR has been 120-125 with fair to average variability + accels  Category I  Cervix Dilation: 3 Effacement (%): 100 Cervical Position: Posterior Station: -3 Presentation: Vertex Exam by:: Dr Erin Fulling  IUPC inserted by Dr Erin Fulling.  Adjust pitocin per MVU measurements.

## 2012-05-23 NOTE — Progress Notes (Signed)
Twyla Arwen Haseley is a 32 y.o. G2P1001 at [redacted]w[redacted]d, admitted for IOL due to diet controlled GDM.  Subjective: Pushing  Objective: BP 148/88  Pulse 117  Temp 98.4 F (36.9 C) (Axillary)  Resp 20  Ht 5\' 2"  (1.575 m)  Wt 148 lb (67.132 kg)  BMI 27.07 kg/m2  SpO2 100%  Fetal Heart Rate: 120 Variability: Mod Accelerations: Present Decelerations: [place] with most contractions  Contractions: Q2-3  SVE:   Dilation: 10 Effacement (%): 100 Station: +1 Exam by:: GPayne,RN  Pitocin: 21 Mag: NA   Assessment / Plan: 32 y.o. G2P1001 at [redacted]w[redacted]d here for IOL for GDM A1  Labor: Progressing well Preeclampsia:  NA Fetal Wellbeing: Cat 2 Pain Control:  PRN IV pushes I/D:  NA  Gwendy Boeder 05/23/2012, 8:58 PM

## 2012-05-23 NOTE — Progress Notes (Signed)
Patient ID: [name], female   DOB: 09-04-1980, 32 y.o.   MRN: [id number]  S/O:   Filed Vitals:   05/23/12 0707  BP: 117/67  Pulse: 77  Temp: 98.5 F (36.9 C)  Resp: 18   Here for IOL for GDM.  FHR 130 with good variability, reactive  UCs every 2-4 minutes, mild  Cervix difficult to assess, closed/50/-3/vtx

## 2012-05-23 NOTE — Progress Notes (Signed)
[name] is a 32 y.o. G2P1001 at [redacted]w[redacted]d  admitted for induction of labor due to Gestational diabetes, diet controlled. TOLAC  Subjective: No pressure  Objective: BP 126/80  Pulse 75  Temp 97.9 F (36.6 C) (Oral)  Resp 16  Ht 5\' 2"  (1.575 m)  Wt 148 lb (67.132 kg)  BMI 27.07 kg/m2  SpO2 100%      FHT:  FHR: 120 bpm, variability: minimal ,  accelerations:  Present,  decelerations:  Present variables, earlys UC:   regular, every 1-3 minutes SVE:   Dilation: 6 Effacement (%): 80 Station: -2 Exam by:: Buddy Duty RN  Labs: Lab Results  Component Value Date   WBC 9.8 05/23/2012   HGB 10.3* 05/23/2012   HCT 32.9* 05/23/2012   MCV 72.8* 05/23/2012   PLT 317 05/23/2012    Assessment / Plan: Induction of labor due to gestational diabetes,  progressing well on pitocin  Labor: SROM, progressing on pit, IUPC came out, will replace if stops making change Preeclampsia:  n/a Fetal Wellbeing:  Category I, II (s/p fentanyl) Pain Control:  Labor support without medications I/D:  n/a Anticipated MOD:  NSVD  Shilah Hefel 05/23/2012, 6:40 PM

## 2012-05-23 NOTE — Progress Notes (Signed)
RN walked into room pt sitting in [place] ? variables

## 2012-05-24 ENCOUNTER — Encounter (HOSPITAL_COMMUNITY): Payer: Self-pay

## 2012-05-24 LAB — CBC
HCT: 27.6 % — ABNORMAL LOW (ref 36.0–46.0)
MCHC: 31.5 g/dL (ref 30.0–36.0)
MCV: 71.7 fL — ABNORMAL LOW (ref 78.0–100.0)
Platelets: 266 10*3/uL (ref 150–400)
RDW: 15.8 % — ABNORMAL HIGH (ref 11.5–15.5)
WBC: 25.7 10*3/uL — ABNORMAL HIGH (ref 4.0–10.5)

## 2012-05-24 MED ORDER — ONDANSETRON HCL 4 MG PO TABS: 4.0000 mg | ORAL_TABLET | ORAL | Status: DC | PRN

## 2012-05-24 MED ORDER — PRENATAL MULTIVITAMIN CH
1.0000 | ORAL_TABLET | Freq: Every day | ORAL | Status: DC
  Administered 2012-05-24 – 2012-05-25 (×2): 1 via ORAL
  Filled 2012-05-24: qty 1

## 2012-05-24 MED ORDER — ACETAMINOPHEN 500 MG PO TABS
1000.0000 mg | ORAL_TABLET | Freq: Once | ORAL | Status: AC
  Administered 2012-05-24: 1000 mg via ORAL
  Filled 2012-05-24: qty 2

## 2012-05-24 MED ORDER — SIMETHICONE 80 MG PO CHEW: 80.0000 mg | CHEWABLE_TABLET | ORAL | Status: DC | PRN

## 2012-05-24 MED ORDER — SENNOSIDES-DOCUSATE SODIUM 8.6-50 MG PO TABS
2.0000 | ORAL_TABLET | Freq: Every day | ORAL | Status: DC
Start: 2012-05-24 — End: 2012-05-25

## 2012-05-24 MED ORDER — WITCH HAZEL-GLYCERIN EX PADS: 1.0000 "application " | MEDICATED_PAD | CUTANEOUS | Status: DC | PRN

## 2012-05-24 MED ORDER — DIBUCAINE 1 % RE OINT: 1.0000 "application " | TOPICAL_OINTMENT | RECTAL | Status: DC | PRN

## 2012-05-24 MED ORDER — GENTAMICIN SULFATE 40 MG/ML IJ SOLN
130.0000 mg | Freq: Three times a day (TID) | INTRAVENOUS | Status: AC
  Administered 2012-05-24 – 2012-05-25 (×3): 130 mg via INTRAVENOUS
  Filled 2012-05-24 (×4): qty 3.25

## 2012-05-24 MED ORDER — ONDANSETRON HCL 4 MG/2ML IJ SOLN: 4.0000 mg | INTRAMUSCULAR | Status: DC | PRN

## 2012-05-24 MED ORDER — DIPHENHYDRAMINE HCL 25 MG PO CAPS: 25.0000 mg | ORAL_CAPSULE | Freq: Four times a day (QID) | ORAL | Status: DC | PRN

## 2012-05-24 MED ORDER — ZOLPIDEM TARTRATE 5 MG PO TABS: 5.0000 mg | ORAL_TABLET | Freq: Every evening | ORAL | Status: DC | PRN

## 2012-05-24 MED ORDER — OXYCODONE-ACETAMINOPHEN 5-325 MG PO TABS: 1.0000 | ORAL_TABLET | ORAL | Status: DC | PRN

## 2012-05-24 MED ORDER — AMPICILLIN SODIUM 1 G IJ SOLR
1.0000 g | Freq: Four times a day (QID) | INTRAMUSCULAR | Status: AC
  Administered 2012-05-24 – 2012-05-25 (×4): 1 g via INTRAVENOUS
  Filled 2012-05-24 (×4): qty 1000

## 2012-05-24 MED ORDER — MISOPROSTOL 200 MCG PO TABS
1000.0000 ug | ORAL_TABLET | Freq: Once | ORAL | Status: AC
  Administered 2012-05-24: 1000 ug via RECTAL
  Filled 2012-05-24: qty 5

## 2012-05-24 MED ORDER — IBUPROFEN 600 MG PO TABS
600.0000 mg | ORAL_TABLET | Freq: Four times a day (QID) | ORAL | Status: DC
  Administered 2012-05-24 – 2012-05-25 (×5): 600 mg via ORAL
  Filled 2012-05-24 (×5): qty 1

## 2012-05-24 MED ORDER — GENTAMICIN SULFATE 40 MG/ML IJ SOLN
140.0000 mg | Freq: Once | INTRAVENOUS | Status: AC
  Administered 2012-05-24: 140 mg via INTRAVENOUS
  Filled 2012-05-24: qty 3.5

## 2012-05-24 MED ORDER — BENZOCAINE-MENTHOL 20-0.5 % EX AERO
1.0000 "application " | INHALATION_SPRAY | CUTANEOUS | Status: DC | PRN
  Administered 2012-05-24: 1 via TOPICAL
  Filled 2012-05-24: qty 56

## 2012-05-24 MED ORDER — LANOLIN HYDROUS EX OINT: TOPICAL_OINTMENT | CUTANEOUS | Status: DC | PRN

## 2012-05-24 NOTE — Consult Note (Signed)
ANTIBIOTIC CONSULT NOTE - INITIAL  Pharmacy Consult for Gentamicin Indication: Endometritis  No Known Allergies  Patient Measurements: Height: 5\' 2"  (157.5 cm) Weight: 148 lb (67.132 kg) IBW/kg (Calculated) : 50.1  Adjusted Body Weight: 55.2 kg  Vital Signs: Temp: 101.5 F (38.6 C) (01/18 0333) Temp src: Oral (01/18 0333) BP: 105/68 mmHg (01/18 0240) Pulse Rate: 93  (01/18 0240) Intake/Output from previous day: 01/17 0701 - 01/18 0700 In: -  Out: 1200 [Urine:900; Blood:300] Intake/Output from this shift: Total I/O In: -  Out: 1200 [Urine:900; Blood:300]  Labs:  [facility] 05/23/12 0735  WBC 9.8  HGB 10.3*  PLT 317  LABCREA --  CREATININE --   CrCl is unknown because no creatinine reading has been taken. No results found for this basename: VANCOTROUGH:2,VANCOPEAK:2,VANCORANDOM:2,GENTTROUGH:2,GENTPEAK:2,GENTRANDOM:2,TOBRATROUGH:2,TOBRAPEAK:2,TOBRARND:2,AMIKACINPEAK:2,AMIKACINTROU:2,AMIKACIN:2, in the last 72 hours   Microbiology: Recent Results (from the past 720 hour(s))  OB RESULTS CONSOLE GBS     Status: Normal      Component Value Range Status Comment   GBS Negative      CULTURE, BETA STREP (GROUP B ONLY)     Status: Normal   Collection Time   04/28/12  8:49 AM      Component Value Range Status Comment   Organism ID, Bacteria NO GROUP B STREP (S.AGALACTIAE) ISOLATED   Final     Medical History: Past Medical History  Diagnosis Date  . No pertinent past medical history   . Gestational diabetes     Medications:  Ampicillin 1 Gm IV every 6 hours  Assessment: 32 yo G2P2002 admitted 1/17 at [redacted]w[redacted]d for IOL for diet-controlled GDM; now S/P delivery of viable female, S/P repaired 2nd degree laceration. Pt has increased temperatures (TMax 102.8) and probable endometritis.  Goal of Therapy:  Gentamicin peaks 6-8 mcg/ml; troughs <1 mcg/ml   Plan:  Gentamicin 140 mg IV loading dose. Maintenance dose 130 mg IV every 8 hours. We will check a serum  creatinine. Serum gentamicin levels as indicated.   Arelia Sneddon 05/24/2012,5:25 AM

## 2012-05-24 NOTE — Progress Notes (Signed)
Post Partum Day #1 Subjective: no complaints and tolerating PO; started on Amp/Gent due to PP fever; breast and bottlefeeding  Objective: Blood pressure 108/61, pulse 91, temperature 99.5 F (37.5 C), temperature source Oral, resp. rate 18, height 5\' 2"  (1.575 m), weight 148 lb (67.132 kg), SpO2 100.00%, unknown if currently breastfeeding.  Physical Exam:  General: alert, cooperative and mild distress Lochia: appropriate Uterine Fundus: firm DVT Evaluation: No evidence of DVT seen on physical exam.   Basename 05/24/12 0523 05/23/12 0735  HGB 8.7* 10.3*  HCT 27.6* 32.9*    Assessment/Plan: PPD#1 Endometritis  Plan for discharge tomorrow or when afebrile x 24 hrs   LOS: 1 day   SHAW, KIMBERLY 05/24/2012, 7:56 AM

## 2012-05-25 MED ORDER — FERROUS SULFATE 325 (65 FE) MG PO TABS: 325.0000 mg | ORAL_TABLET | Freq: Two times a day (BID) | ORAL | Status: AC

## 2012-05-25 MED ORDER — IBUPROFEN 600 MG PO TABS: 600.0000 mg | ORAL_TABLET | Freq: Four times a day (QID) | ORAL | Status: DC | PRN

## 2012-05-25 NOTE — Discharge Summary (Signed)
Obstetric Discharge Summary Reason for Admission: induction of labor d/t A1DM Prenatal Procedures: none Intrapartum Procedures: vacuum and deep 2nd degree laceration Postpartum Procedures: amp & gent x 24hrs for endometritis Complications-Operative and Postpartum: deep 2nd degree degree perineal laceration, pp endometritis Eating, drinking, voiding, ambulating well.  +flatus.  Lochia and pain wnl.  No complaints.   Hemoglobin  Date Value Range Status  05/24/2012 8.7* 12.0 - 15.0 g/dL Final     HCT  Date Value Range Status  05/24/2012 27.6* 36.0 - 46.0 % Final   Afebrile x >24hrs, last fever 1/18 @ 0333 101.5 Receiving last dose of iv antibiotics now  Physical Exam:  General: alert, cooperative and no distress Lochia: appropriate Uterine Fundus: firm Incision: n/a DVT Evaluation: No evidence of DVT seen on physical exam. Negative Homan's sign. No cords or calf tenderness. No significant calf/ankle edema.  Discharge Diagnoses: Term Pregnancy-delivered  Discharge Information: Date: 05/25/2012 Activity: pelvic rest Diet: routine Medications: PNV and Ibuprofen, Fe Condition: stable Instructions: refer to practice specific booklet Discharge to: home Follow-up Information    Schedule an appointment as soon as possible for a visit with [facility]. (4-6 weeks for your postpartum check)    Contact information:   [address] [id number] Wilhemina Bonito [place] [address] [phone]         Newborn Data: Live born female  Birth Weight: 8 lb 14 oz (4026 g) APGAR: 9, 9  Home with mother pending serum bilirubin Breast and bottlefeeding, wants depo for contraception, no circumcision  Marge Duncans 05/25/2012, 7:51 AM

## 2012-05-26 LAB — TYPE AND SCREEN
ABO/RH(D): O POS
Unit division: 0

## 2012-05-26 NOTE — Progress Notes (Signed)
Post discharge chart review completed.  

## 2012-05-27 NOTE — Discharge Summary (Signed)
Attestation of Attending Supervision of Advanced Practitioner (CNM/NP): Evaluation and management procedures were performed by the Advanced Practitioner under my supervision and collaboration. I have reviewed the Advanced Practitioner's note and chart, and I agree with the management and plan.  Rhea Thrun H. 2:28 PM   

## 2012-06-23 ENCOUNTER — Ambulatory Visit (INDEPENDENT_AMBULATORY_CARE_PROVIDER_SITE_OTHER): Payer: Self-pay | Admitting: Medical

## 2012-06-23 ENCOUNTER — Encounter: Payer: Self-pay | Admitting: Medical

## 2012-06-23 VITALS — BP 118/78 | HR 66 | Temp 98.2°F | Wt 128.8 lb

## 2012-06-23 DIAGNOSIS — O0990 Supervision of high risk pregnancy, unspecified, unspecified trimester: Secondary | ICD-10-CM

## 2012-06-23 DIAGNOSIS — Z3049 Encounter for surveillance of other contraceptives: Secondary | ICD-10-CM

## 2012-06-23 DIAGNOSIS — Z3042 Encounter for surveillance of injectable contraceptive: Secondary | ICD-10-CM

## 2012-06-23 MED ORDER — MEDROXYPROGESTERONE ACETATE 150 MG/ML IM SUSP
150.0000 mg | Freq: Once | INTRAMUSCULAR | Status: AC
  Administered 2012-06-23: 150 mg via INTRAMUSCULAR

## 2012-06-23 NOTE — Patient Instructions (Signed)
Medroxyprogesterone injection [Contraceptive] Qu es este medicamento? Las inyecciones anticonceptivas de MEDROXIPROGESTERONA previenen Water quality scientist. Las [employer] brindarn control de la natalidad durante 3 meses. La Depo-subQ Provera 104 se utiliza tambin para tratar [employer] relacionado con endometriosis. Este medicamento puede ser utilizado para otros usos; si tiene alguna pregunta consulte con su proveedor de atencin mdica o con su farmacutico. Qu le debo informar a mi profesional de la salud antes de tomar este medicamento? Necesita saber si usted presenta alguno de los siguientes problemas o situaciones: -si consume alcohol con frecuencia -asma -enfermedad vascular o antecedente de cogulos sanguneos en los pulmones o las piernas -enfermedad de los [place], como osteoporosis -cncer de mama -diabetes -trastornos de la alimentacin (anorexia nerviosa o bulimia) -alta presin sangunea -infecciones por VIH o SIDA -enfermedad renal -enfermedad heptica -depresin mental -migraa -convulsiones -derrame cerebral -fuma tabaco -sangrado vaginal -una reaccin alrgica o inusual a la medroxiprogesterona, otras hormonas, otros medicamentos, alimentos, colorantes o conservantes -si est embarazada o buscando quedar embarazada -si est amamantando a un beb Cmo debo utilizar este medicamento? El anticonceptivo de Depo-Provera se inyecta por va intramuscular. La Depo-SubQ Provera 104 se inyecta por va subcutnea. Las [employer] un profesional de Technical sales engineer. Usted no puede estar embarazada antes de recibir una inyeccin. La inyeccin normalmente se aplica durante los primeros 5 das despus de comenzar un perodo menstrual o 6 semanas despus de un parto. Hable con su pediatra para informarse acerca del uso de este medicamento en nios. Puede requerir atencin especial. Estas inyecciones han sido usadas en nias que han empezados a tener perodos [place]. Sobredosis: Pngase en  contacto inmediatamente con un centro toxicolgico o una sala de urgencia si usted cree que haya tomado demasiado medicamento. ATENCIN: [employer] es solo para usted. No comparta este medicamento con nadie. Qu sucede si me olvido de una dosis? Trate de no olvidar ninguna dosis. Para mantener el control de natalidad necesitar una inyeccin cada 3 meses. Si no puede asistir a una cita, comunquese con su profesional de la salud para que se la [place]. Si espera ms de 13 semanas entre las inyecciones anticonceptivos de Depo-Provera o ms de 14 semanas entre inyecciones anticonceptivos de Depo-subQ Provera 104, puede quedarse [place]. Si no puede asistir a su cita utilice otro mtodo anticonceptivo. Tal vez deba hacerse una prueba de embarazo antes de recibir otra inyeccin. Qu puede interactuar con este medicamento? No tome esta medicina con ninguno de los siguientes medicamentos: -bosentano Esta medicina tambin puede interactuar con los siguientes medicamentos: -aminoglutethimide -antibiticos o medicamentos para infecciones, especialmente rifampicina, rifabutina, rifapentina y griseofulvina -aprepitant -barbitricos, tales como el fenobarbital o primidona -bexaroteno -carbamazepina -medicamentos para convulsiones, tales como etotona, felbamato, [place], [place], topiramato -modafinilo -hierba de [place] Puede ser que esta lista no menciona todas las posibles interacciones. Informe a su profesional de [employer]	[place] de [employer] productos a base de hierbas, medicamentos de [place] o suplementos nutritivos que est tomando. Si usted fuma, consume bebidas alcohlicas o si utiliza drogas ilegales, indqueselo tambin a su profesional de [employer]	[place]. Algunas sustancias pueden interactuar con su medicamento. A qu debo estar atento al usar [employer]? Este medicamento no la protege de la infeccin por VIH (SIDA) ni de otras enfermedades de transmisin sexual. El uso de este  producto puede provocar una prdida de calcio de sus huesos. La prdida de calcio puede provocar huesos dbiles (osteoporosis). Slo use este producto durante ms de 2 aos si otras formas de anticonceptivos no son apropiados para usted.  Mientre ms tiempo use este producto para el control de la natalidad, tendr ms riesgo de Marine scientist de [employer]. Consulte a su profesional de Contractor de cmo puede [employer] fuertes. Puede experimentar un cambio en el patrn de sangrado o periodos irregulares. Muchas mujeres dejan de tener periodos [employer]. Si recibe sus inyecciones a tiempo, la posibilidad de quedarse embarazada es muy baja. Si cree que podr [employer], visite a su profesional de la salud lo antes posible. Si desea quedar embarazada dentro del prximo ao, informe a su profesional de [employer]. [place] de este medicamento puede perdurar durante mucho tiempo despus de recibir su ltima inyeccin. Qu efectos secundarios puedo tener al [employer] este medicamento? Efectos secundarios que debe informar a su mdico o a Producer, television/film/video de la salud tan pronto como sea posible: -Therapist, art como erupcin cutnea, picazn o urticarias, hinchazn de la cara, labios o lengua -secreciones o sensibilidad de las mamas -problemas respiratorios -cambios en la visin -depresin -sensacin de desmayos o mareos, cadas -fiebre -dolor en el abdomen, pecho, entrepierna o piernas -problemas de coordinacin, del habla, al caminar -cansancio o debilidad inusual -color amarillento de los ojos o la piel Efectos secundarios que, por lo general, no requieren Psychologist, prison and probation services (debe informarlos a mdico o a Producer, television/film/video de la salud si persisten o si son molestos): -cne -retencin de lquidos e hinchazn -dolor de cabeza -perodos menstruales irregulares, manchando o ausencia de perodos menstruales -dolor, picazn o reaccin cutnea temporal en el lugar de la  inyeccin -aumento de peso Puede ser que esta lista no menciona todos los posibles efectos secundarios. Comunquese a su mdico por asesoramiento mdico [employer]. Usted puede informar los efectos secundarios a la FDA por telfono al 1-800-FDA-1088. Dnde debo guardar mi medicina? No se aplica en este caso. Un profesional de Associate Professor las inyecciones. ATENCIN: Este folleto es un resumen. Puede ser que no cubra toda la posible informacin. Si usted tiene preguntas acerca de esta medicina, consulte con su mdico, su farmacutico o su profesional de Radiographer, therapeutic.  2013, Elsevier/Gold Standard. (07/05/2008 3:09:00 PM)  Hernia (Hernia) Una hernia ocurre cuando un rgano interno protruye a travs de un punto dbil de los msculos de la pared muscular abdominal (del vientre). Se producen con mayor frecuencia en la ingle y alrededor del ombligo. Generalmente puede volver a Museum/gallery exhibitions officer (reducirse). La mayor parte de las hernias tienden a Theme park manager con Museum/gallery conservator. Algunas hernias abdominales pueden atascarse en la abertura (hernias irreductibles o hernia encarcelada) y no pueden reducirse. Una hernia abdominal irreducible que est ligeramente apretada en la abertura, corre el riesgo de daar el flujo de sangre (hernia estrangulada). Una hernia estrangulada es una emergencia mdica. Debido al riesgo que se corre en caso de hernia irreducible o estrangulada, se recomienda la ciruga para repararla. CAUSAS  Levantar peso excesivo.  Mucha tos.  Tensin al ir de cuerpo.  Durante la ciruga abdominal se realiza un corte (incisin). INSTRUCCIONES PARA EL CUIDADO DOMICILIARIO  No es necesario hacer reposo en cama. Puede continuar con sus actividades habituales.  Evite levantar peso (ms de 10 libras o 4,5 Kg) o hacer esfuerzos.  Tos con suavidad. Si actualmente usted fuma, es el momento de abandonar el hbito. Hasta el procedimiento quirrgico ms perfecto puede  malograrse si se hace fuerza contnua para toser. Aunque su hernia no haya sido reparada, la tos puede agravar el problema.  No use nada apretado sobre  la hernia. No trate de mantenerla adentro con un vendaje externo o un braguero. Puede lesionar el contenido abdominal si los aprieta dentro del saco de la hernia.  Consumir una dieta normal.  Evite la constipacin. Si hace mucha fuerza aumentar el tamao de la hernia y podr daarse la reparacin. Si no lo logra slo con la dieta, puede usar laxantes. SOLICITE ATENCIN MDICA DE INMEDIATO SI:  Tiene fiebre.  Presenta un dolor abdominal cada vez ms intenso.  Si tiene Programme researcher, broadcasting/film/video (nuseas) y vmitos.  La hernia se ha atascado fuera del abdomen, se ve descolorida, se siente dura o le duele.  Observa cambios en el hbito intestinal o en la hernia, lo que no es habitual en usted.  El dolor o la hinchazn alrededor de la hernia [place].  No puede volver a Electrical engineer hernia en su lugar ejerciendo una presin suave mientras se encuentra recostado. EST SEGURO QUE:   Comprende las instrucciones para el alta mdica.  Controlar su enfermedad.  Solicitar atencin mdica de inmediato segn las indicaciones. Document Released: 04/23/2005 Document Revised: 07/16/2011 [facility] Patient Information 2013 [place], [place].

## 2012-06-23 NOTE — Progress Notes (Signed)
Patient ID: [name], female   DOB: [birth year], 32 y.o.   MRN: [id number] Subjective:     [name] is a 32 y.o. female who presents for a postpartum visit. She is 4 weeks postpartum following a vacuum assisted SVD. I have fully reviewed the prenatal and intrapartum course. The delivery was at 40.1 gestational weeks. Outcome: spontaneous vaginal delivery, VBAC attempted and vacuum, low. Anesthesia: IV sedation. Postpartum course has been normal. Baby's course has been normal. Baby is feeding by breast. Bleeding no bleeding. Bowel function is normal. Bladder function is normal. Patient is not sexually active. Contraception method is none. Postpartum depression screening: negative. Patient complaint of "lump" just above the umbilicus that hurts sometimes.   The following portions of the patient's history were reviewed and updated as appropriate: allergies, current medications, past family history, past medical history, past social history, past surgical history and problem list.  Review of Systems Pertinent items are noted in HPI.   Objective:    BP 118/78  Pulse 66  Temp(Src) 98.2 F (36.8 C)  Wt 128 lb 12.8 oz (58.423 kg)  BMI 23.55 kg/m2  Breastfeeding? Yes  General:  alert and cooperative   Breasts:  Not performed  Lungs: clear to auscultation bilaterally  Heart:  regular rate and rhythm, S1, S2 normal, no murmur, click, rub or gallop  Abdomen: abnormal findings:  small ventral hernia just above the umbilicus   Vulva:  normal  Vagina: normal vagina  Cervix:  no cervical motion tenderness and no lesions  Corpus: normal size, contour, position, consistency, mobility, non-tender  Adnexa:  normal adnexa and no mass, fullness, tenderness  Rectal Exam: Not performed.        Assessment:     Normal postpartum exam. Pap smear not done at today's visit.   Plan:    1. Contraception: Depo-Provera injections . Depo Provera injection today.  2. Patient to follow-up  for next Depo Provera at the [facility]. Also instructed to follow-up about the ?hernia at that time. May require a referral to general surgery.  3. Follow up in: 12 weeks or as needed.

## 2013-01-26 ENCOUNTER — Encounter: Payer: Self-pay | Admitting: Family Medicine

## 2013-01-26 ENCOUNTER — Ambulatory Visit (INDEPENDENT_AMBULATORY_CARE_PROVIDER_SITE_OTHER): Payer: Self-pay | Admitting: Family Medicine

## 2013-01-26 VITALS — BP 112/72 | HR 70 | Temp 98.8°F | Ht 61.5 in | Wt 123.0 lb

## 2013-01-26 DIAGNOSIS — R1011 Right upper quadrant pain: Secondary | ICD-10-CM | POA: Insufficient documentation

## 2013-01-26 NOTE — Patient Instructions (Addendum)
Ha sido un placer verle hoy. - Por favor tome las medicinas como se le han recetado. puedes tomar ibuprofeno cada 8 horas si volviese a tener dolor. - Le he dado la informacion necesaria para la tarjeta anaranjada. Es muy importante que pase por ese proceso para poderla ayudar desde el punto de vista financiero si es que necesitase analisis, imagenes o [place] - Si tuviese mucho dolor, fiebre, nausea o vomitos necesita ser evaluada lo antes posible.

## 2013-01-26 NOTE — Assessment & Plan Note (Signed)
No signs of acute abdomen today. Pain clinically seems to be related to gallbladder, possible gallstones. She is as symptomatic today. Recommended  low-fat content diet.  Discussed signs of worsening condition that should prompt re-evaluation. Consider ultrasound if pain reappears. Follow up as needed

## 2013-01-26 NOTE — Progress Notes (Signed)
Family Medicine Office Visit Note   Subjective:   Patient ID: [name], female  DOB: [birth year], 32 y.o.. MRN: [id number]   Pt that comes today complaining of abdominal pain. Visit conducted in Spanish. She reports right upper quadrant pain started 8 days ago. Pain was constant with intensity of 4-5/10, dull in quality. Pain was not related to intake, and temporally alleviate with NSAIDs.  She reports since Friday pain was decreasing and today her pain level is 0. Denies nausea, vomiting, fever, chills, urinary symptoms or diarrhea  Review of Systems:  Per HPI  Objective:   Physical Exam: Gen:  NAD  HEENT: Moist mucous membranes. CV: Regular rate and rhythm, no murmurs rubs or gallops PULM: Clear to auscultation bilaterally.  ABD: Soft, presence of a mass on her right aspect of her abdomen at the level of mamillary line that increases size with cough and it sis reducible when patient laying down.   Abdomen is mildly tender on right upper quadrant with positive Murphy's sign only with deep palpation, no guarding , no rebound tenderness. Abdomen non distended, normal bowel sounds. No CVA tenderness,. EXT: No edema  Assessment & Plan:

## 2013-01-28 ENCOUNTER — Ambulatory Visit: Payer: Medicaid Other

## 2013-02-09 ENCOUNTER — Ambulatory Visit: Payer: Medicaid Other | Admitting: Internal Medicine

## 2013-03-13 ENCOUNTER — Ambulatory Visit: Payer: Self-pay

## 2013-03-20 ENCOUNTER — Ambulatory Visit: Payer: No Typology Code available for payment source

## 2014-03-08 ENCOUNTER — Encounter: Payer: Self-pay | Admitting: Family Medicine

## 2015-03-21 ENCOUNTER — Emergency Department (HOSPITAL_COMMUNITY): Payer: Self-pay

## 2015-03-21 ENCOUNTER — Encounter (HOSPITAL_COMMUNITY): Payer: Self-pay | Admitting: Emergency Medicine

## 2015-03-21 ENCOUNTER — Emergency Department (HOSPITAL_COMMUNITY)
Admission: EM | Admit: 2015-03-21 | Discharge: 2015-03-21 | Disposition: A | Discharge status: Home / Self Care | Payer: Self-pay | Attending: Emergency Medicine | Admitting: Emergency Medicine

## 2015-03-21 DIAGNOSIS — Z79899 Other long term (current) drug therapy: Secondary | ICD-10-CM | POA: Insufficient documentation

## 2015-03-21 DIAGNOSIS — K8020 Calculus of gallbladder without cholecystitis without obstruction: Secondary | ICD-10-CM | POA: Insufficient documentation

## 2015-03-21 DIAGNOSIS — Z8632 Personal history of gestational diabetes: Secondary | ICD-10-CM | POA: Insufficient documentation

## 2015-03-21 LAB — COMPREHENSIVE METABOLIC PANEL
ALBUMIN: 4.5 g/dL (ref 3.5–5.0)
ALT: 24 U/L (ref 14–54)
AST: 25 U/L (ref 15–41)
Alkaline Phosphatase: 81 U/L (ref 38–126)
Anion gap: 9 (ref 5–15)
BILIRUBIN TOTAL: 0.6 mg/dL (ref 0.3–1.2)
BUN: 11 mg/dL (ref 6–20)
CHLORIDE: 105 mmol/L (ref 101–111)
CO2: 25 mmol/L (ref 22–32)
Calcium: 9.7 mg/dL (ref 8.9–10.3)
Creatinine, Ser: 0.6 mg/dL (ref 0.44–1.00)
GFR calc Af Amer: >60 mL/min (ref >60)
GFR calc non Af Amer: >60 mL/min (ref >60)
GLUCOSE: 149 mg/dL — AB (ref 65–99)
POTASSIUM: 3.9 mmol/L (ref 3.5–5.1)
SODIUM: 139 mmol/L (ref 135–145)
TOTAL PROTEIN: 7.8 g/dL (ref 6.5–8.1)

## 2015-03-21 LAB — CBC
HEMATOCRIT: 41.2 % (ref 36.0–46.0)
Hemoglobin: 13.7 g/dL (ref 12.0–15.0)
MCH: 28.3 pg (ref 26.0–34.0)
MCHC: 33.3 g/dL (ref 30.0–36.0)
MCV: 85.1 fL (ref 78.0–100.0)
Platelets: 340 10*3/uL (ref 150–400)
RBC: 4.84 MIL/uL (ref 3.87–5.11)
RDW: 12.5 % (ref 11.5–15.5)
WBC: 19.1 10*3/uL — ABNORMAL HIGH (ref 4.0–10.5)

## 2015-03-21 LAB — URINALYSIS, ROUTINE W REFLEX MICROSCOPIC
BILIRUBIN URINE: NEGATIVE
Glucose, UA: NEGATIVE mg/dL
Ketones, ur: 15 mg/dL — AB
Leukocytes, UA: NEGATIVE
Nitrite: NEGATIVE
PH: 7 (ref 5.0–8.0)
Protein, ur: 30 mg/dL — AB
SPECIFIC GRAVITY, URINE: 1.027 (ref 1.005–1.030)
Urobilinogen, UA: 1 mg/dL (ref 0.0–1.0)

## 2015-03-21 LAB — URINE MICROSCOPIC-ADD ON

## 2015-03-21 LAB — POC URINE PREG, ED: Preg Test, Ur: NEGATIVE

## 2015-03-21 LAB — LIPASE, BLOOD: Lipase: 31 U/L (ref 11–51)

## 2015-03-21 MED ORDER — HYDROCODONE-ACETAMINOPHEN 5-325 MG PO TABS: 1.0000 | ORAL_TABLET | ORAL | Status: AC | PRN

## 2015-03-21 MED ORDER — MORPHINE SULFATE (PF) 4 MG/ML IV SOLN
4.0000 mg | Freq: Once | INTRAVENOUS | Status: AC
  Administered 2015-03-21: 4 mg via INTRAVENOUS
  Filled 2015-03-21: qty 1

## 2015-03-21 MED ORDER — ONDANSETRON HCL 4 MG/2ML IJ SOLN
4.0000 mg | Freq: Once | INTRAMUSCULAR | Status: AC
  Administered 2015-03-21: 4 mg via INTRAVENOUS
  Filled 2015-03-21: qty 2

## 2015-03-21 MED ORDER — SODIUM CHLORIDE 0.9 % IV BOLUS (SEPSIS)
1000.0000 mL | Freq: Once | INTRAVENOUS | Status: AC
  Administered 2015-03-21: 1000 mL via INTRAVENOUS

## 2015-03-21 MED ORDER — ONDANSETRON HCL 4 MG PO TABS: 4.0000 mg | ORAL_TABLET | Freq: Four times a day (QID) | ORAL | Status: AC | PRN

## 2015-03-21 NOTE — Discharge Instructions (Signed)
Cólico biliar °(Biliary Colic) °El cólico biliar es un dolor en la región superior del abdomen. El dolor: °· Generalmente, se siente en la región superior derecha del abdomen, pero también puede aparecer en la región central, justo debajo del esternón. °· Puede irradiarse a la espalda, hacia el omóplato derecho. °· Puede ser constante o discontinuo. °· Puede estar acompañado de náuseas y vómitos. °La mayor parte del tiempo, desaparece en el término de 1 a 5 horas. Una vez que el dolor intenso desaparece, puede haber dolor abdominal leve que se prolonga durante aproximadamente 24 horas. °La causa del cólico biliar es una obstrucción en las vías biliares. Las vías biliares son conductos que transportan bilis, un líquido que ayuda a digerir las grasas, desde la vesícula biliar hasta el intestino delgado. Generalmente, el cólico biliar ocurre después de comer, cuando el sistema digestivo necesita bilis. El dolor aparece cuando las células musculares se contraen bruscamente para intentar mover la obstrucción, para que la bilis pueda pasar. °INSTRUCCIONES PARA EL CUIDADO EN EL HOGAR °· Tome los medicamentos solamente como se lo haya indicado el médico. °· Beba suficiente líquido para mantener la orina clara o de color amarillo pálido. °· Evite los alimentos grasos y fritos. Este tipo de alimentos aumentan la demanda de bilis del organismo. °· Evite los alimentos que intensifican el dolor. °· No coma en exceso. °· No ingiera una comida abundante después de ayunar. °SOLICITE ATENCIÓN MÉDICA SI: °· Tiene fiebre. °· El dolor empeora. °· Vomita. °· Tiene náuseas que le impiden la ingesta de comidas y bebidas. °SOLICITE ATENCIÓN MÉDICA DE INMEDIATO SI: °· De manera repentina, tiene fiebre y escalofríos. °· Observa una coloración amarillenta (ictericia) de: °¨ La piel. °¨ Las partes blancas de los ojos. °¨ Las membranas mucosas. °· Siente dolor continuo o intenso que no se alivia con los medicamentos. °· Tiene náuseas y vómitos  que no se alivian con los medicamentos. °· Tiene mareos o se desmaya. °  °Esta información no tiene como fin reemplazar el consejo del médico. Asegúrese de hacerle al médico cualquier pregunta que tenga. °  °Document Released: 07/31/2007 Document Revised: 09/07/2014 °Elsevier Interactive Patient Education ©2016 Elsevier Inc. ° °

## 2015-03-21 NOTE — ED Notes (Signed)
C/o mid abd pain since 4pm Sunday with nausea and vomited x 2.

## 2015-03-21 NOTE — ED Provider Notes (Signed)
CSN: [id number]     Arrival date & time 03/21/15  0219 History   By signing my name below, I, Arlan Organ, attest that this documentation has been prepared under the direction and in the presence of Loren Racer, MD.  Electronically Signed: Arlan Organ, ED Scribe. 03/21/2015. 3:13 AM.   Chief Complaint  Patient presents with  . Abdominal Pain   The history is provided by the patient. No language interpreter was used.    HPI Comments: [name] is a 34 y.o. female without any pertinent past medical history who presents to the Emergency Department complaining of constant, ongoing, gradual onset abdominal pain x 12 hours. No aggravating or alleviating factors at this time. 2 episodes of vomiting with associated nausea also reported. [name] admits to a history of same but denies any association with eating. No OTC medications or home remedies attempted prior to arrival. No recent fever, chills, chest pain, or shortness of breath. No previous history of abdominal surgeries.  PCP: No PCP Per Patient    Past Medical History  Diagnosis Date  . No pertinent past medical history   . Gestational diabetes    Past Surgical History  Procedure Laterality Date  . Cesarean section     Family History  Problem Relation Age of Onset  . Hypertension Mother   . Diabetes Father    Social History  Substance Use Topics  . Smoking status: Never Smoker   . Smokeless tobacco: Never Used  . Alcohol Use: No   OB History    Gravida Para Term Preterm AB TAB SAB Ectopic Multiple Living   Review of Systems  Constitutional: Negative for fever and chills.  Respiratory: Negative for cough and shortness of breath.   Cardiovascular: Negative for chest pain.  Gastrointestinal: Positive for nausea, vomiting and abdominal pain. Negative for diarrhea and constipation.  Genitourinary: Negative for dysuria, frequency, hematuria, flank pain, vaginal bleeding, vaginal  discharge, menstrual problem and pelvic pain.  Musculoskeletal: Negative for back pain, neck pain and neck stiffness.  Skin: Negative for rash.  Neurological: Negative for dizziness, weakness, numbness and headaches.  Psychiatric/Behavioral: Negative for confusion.  All other systems reviewed and are negative.     Allergies  Review of patient's allergies indicates no known allergies.  Home Medications   Prior to Admission medications   Medication Sig Start Date End Date Taking? Authorizing Provider  acetaminophen (TYLENOL) 325 MG tablet Take 325 mg by mouth every 6 (six) hours as needed for mild pain.   Yes Historical Provider, MD  ferrous sulfate 325 (65 FE) MG tablet Take 1 tablet (325 mg total) by mouth 2 (two) times daily with a meal. 05/25/12   Cheral Marker, CNM  HYDROcodone-acetaminophen (NORCO/VICODIN) 5-325 MG tablet Take 1-2 tablets by mouth every 4 (four) hours as needed for severe pain. 03/21/15   Loren Racer, MD  ondansetron (ZOFRAN) 4 MG tablet Take 1 tablet (4 mg total) by mouth every 6 (six) hours as needed for nausea or vomiting. 03/21/15   Loren Racer, MD  Prenatal Vit-Fe Fumarate-FA (PRENATAL MULTIVITAMIN) TABS Take 1 tablet by mouth daily.    Historical Provider, MD   Triage Vitals: BP 114/75 mmHg  Pulse 80  Temp(Src) 97.5 F (36.4 C) (Oral)  Resp 24  SpO2 99%   Physical Exam  Constitutional: She is oriented to person, place, and time. She appears well-developed and well-nourished. No distress.  HENT:  Head: Normocephalic and atraumatic.  Mouth/Throat: Oropharynx is clear and moist.  Eyes: EOM are normal. Pupils are equal, round, and reactive to light.  Neck: Normal range of motion. Neck supple.  Cardiovascular: Normal rate and regular rhythm.   Pulmonary/Chest: Effort normal and breath sounds normal. No respiratory distress. She has no wheezes. She has no rales.  Abdominal: Soft. Bowel sounds are normal. She exhibits no distension and no mass.  There is tenderness (Patient has right upper quadrant tenderness with palpation. There is no rebound or guarding.). There is no rebound and no guarding.  Musculoskeletal: Normal range of motion. She exhibits no edema or tenderness.  No CVA tenderness bilaterally.  Neurological: She is alert and oriented to person, place, and time.  Moves all extremities without deficit. Sensation is fully intact.  Skin: Skin is warm and dry. No rash noted. No erythema.  Psychiatric: She has a normal mood and affect. Her behavior is normal.  Nursing note and vitals reviewed.   ED Course  Procedures (including critical care time)  DIAGNOSTIC STUDIES: Oxygen Saturation is 100% on RA, Normal by my interpretation.    COORDINATION OF CARE: 3:08 AM- Will give Morphine, Zofran, and fluids. Will order US abdomen complete, Lipase, CMP, CBC, urinalysis, and urine pregnancy. Discussed treatment plan with pt at bedside and pt agreed to plan.     Labs Review Labs Reviewed  COMPREHENSIVE METABOLIC PANEL - Abnormal; Notable for the following:    Glucose, Bld 149 (*)    All other components within normal limits  CBC - Abnormal; Notable for the following:    WBC 19.1 (*)    All other components within normal limits  URINALYSIS, ROUTINE W REFLEX MICROSCOPIC (NOT AT [facility]) - Abnormal; Notable for the following:    Color, Urine AMBER (*)    APPearance CLOUDY (*)    Hgb urine dipstick SMALL (*)    Ketones, ur 15 (*)    Protein, ur 30 (*)    All other components within normal limits  URINE MICROSCOPIC-ADD ON - Abnormal; Notable for the following:    Bacteria, UA MANY (*)    All other components within normal limits  LIPASE, BLOOD  POC URINE PREG, ED    Imaging Review [place] Abdomen Complete  03/21/2015  CLINICAL DATA:  Acute onset of right upper quadrant abdominal pain, nausea and vomiting. Initial encounter. EXAM: ULTRASOUND ABDOMEN COMPLETE COMPARISON:  None. FINDINGS: Gallbladder: Multiple small mobile stones are  seen within the gallbladder. The gallbladder is otherwise unremarkable in appearance. No gallbladder wall thickening or pericholecystic fluid is seen. No ultrasonographic Murphy's sign is elicited. Common bile duct: Diameter: 0.6 cm, borderline normal in caliber. Liver: No focal lesion identified. Parenchymal echogenicity remains within normal limits. IVC: No abnormality visualized. Pancreas: Visualized portion unremarkable. Spleen: Size and appearance within normal limits. Right Kidney: Length: 11.9 cm. Echogenicity within normal limits. No mass or hydronephrosis visualized. Left Kidney: Length: 12.6 cm. Echogenicity within normal limits. No mass or hydronephrosis visualized. Abdominal aorta: No aneurysm visualized. Other findings: None. IMPRESSION: 1. Cholelithiasis; gallbladder otherwise unremarkable in appearance. No evidence for obstruction or cholecystitis. 2. Otherwise unremarkable abdominal ultrasound. Electronically Signed   By: Roanna RaiderJeffery  Chang M.D.   On: 03/21/2015 04:04   I have personally reviewed and evaluated these images and lab results as part of my medical decision-making.   EKG Interpretation None      MDM   Final diagnoses:  Calculus of gallbladder without cholecystitis without obstruction    I  personally performed the services described in this documentation, which was scribed in my presence. The recorded information has been reviewed and is accurate.   Patient is now pain-free. Ultrasound demonstrates cholelithiasis without evidence of cholecystitis. Normal LFTs. Mild elevation in white blood cell count. Explained the patient needs to follow-up closely with Gen. surgery. She is to avoid fatty foods. She understands the need to return for persistent pain, vomiting or fever.  Loren Racer, MD 03/21/15 [phone]

## 2016-08-17 IMAGING — US US ABDOMEN COMPLETE
1 series · 14 of 25 positions shown · non-contrast
Comparison: None.

CLINICAL DATA: Acute onset of right upper quadrant abdominal pain,
nausea and vomiting. Initial encounter.

EXAM:
ULTRASOUND ABDOMEN COMPLETE

[Series 1: us abdomen complete · 0.20mm/px · 14 of 89 slices shown]
[im 1/89]
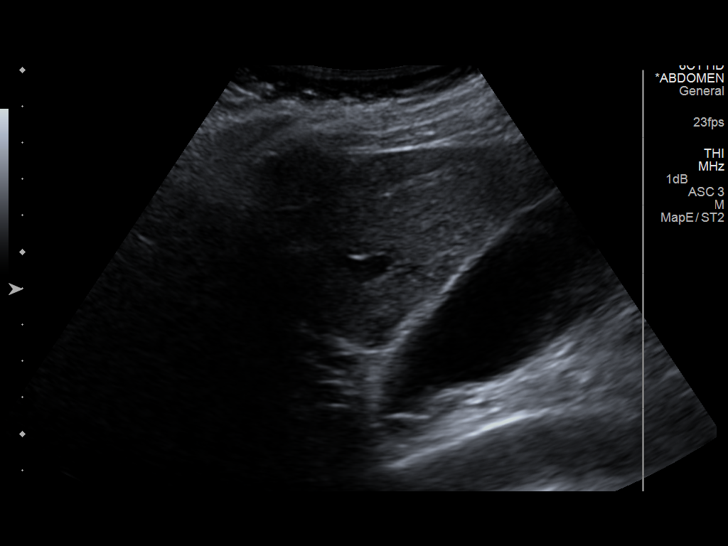
[im 8/89]
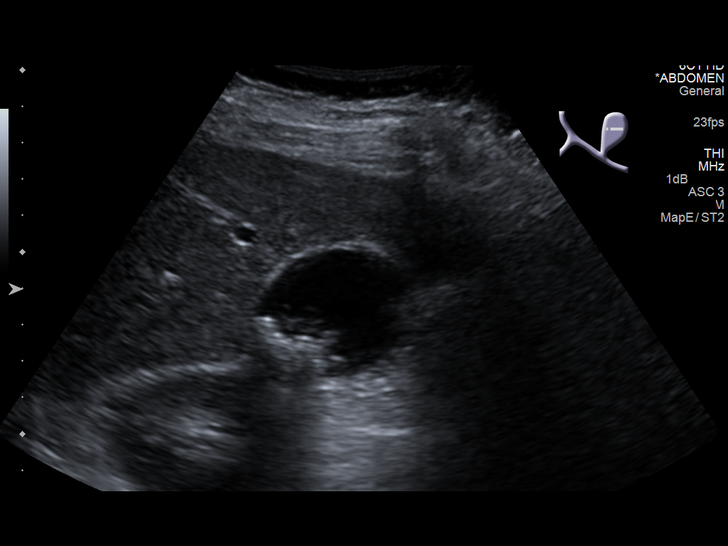
[im 15/89]
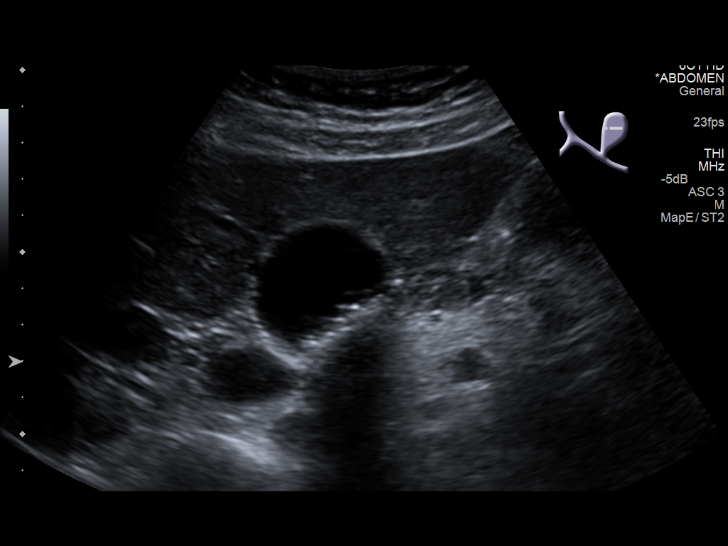
[im 23/89]
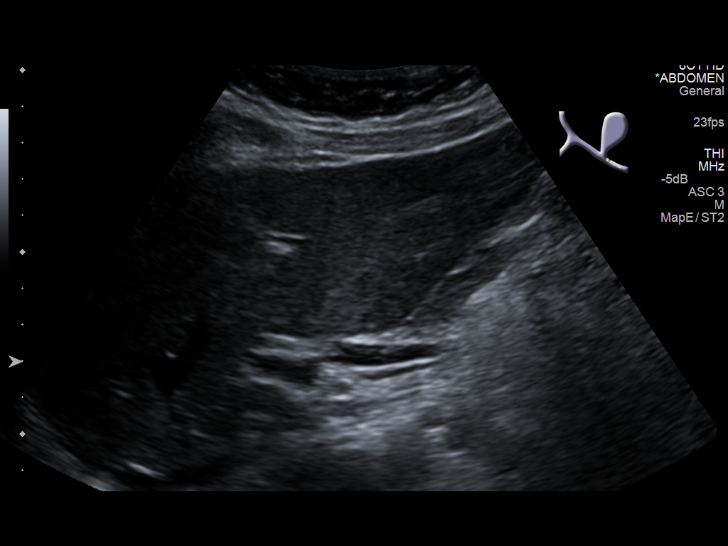
[im 30/89]
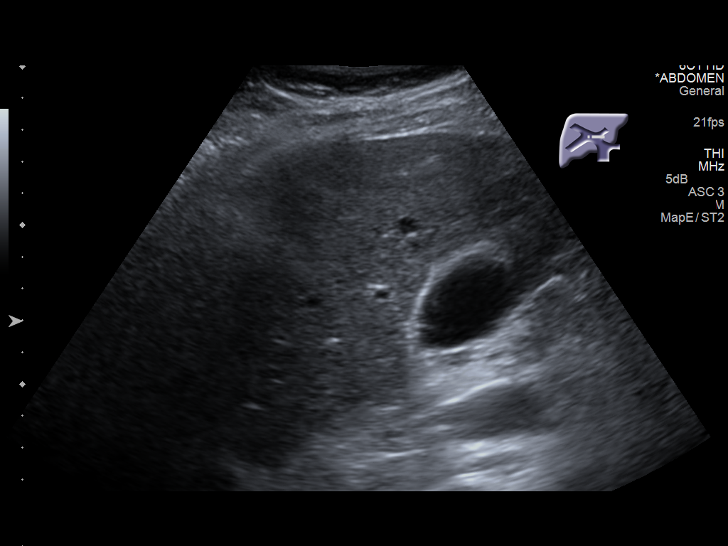
[im 34/89]
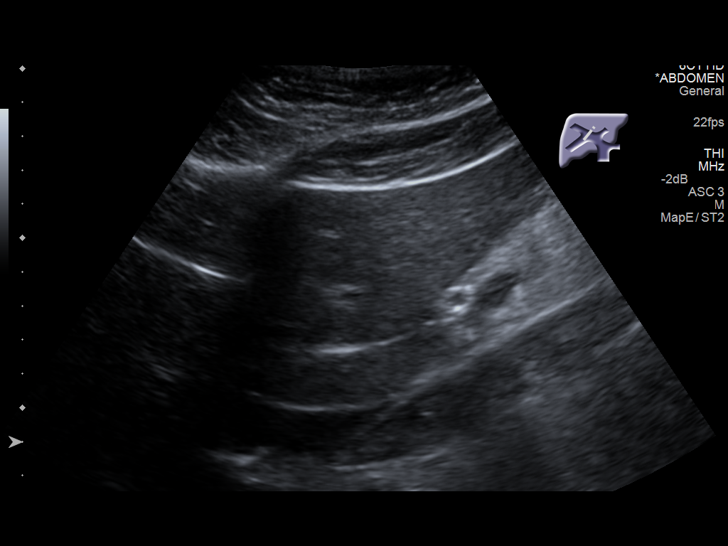
[im 41/89]
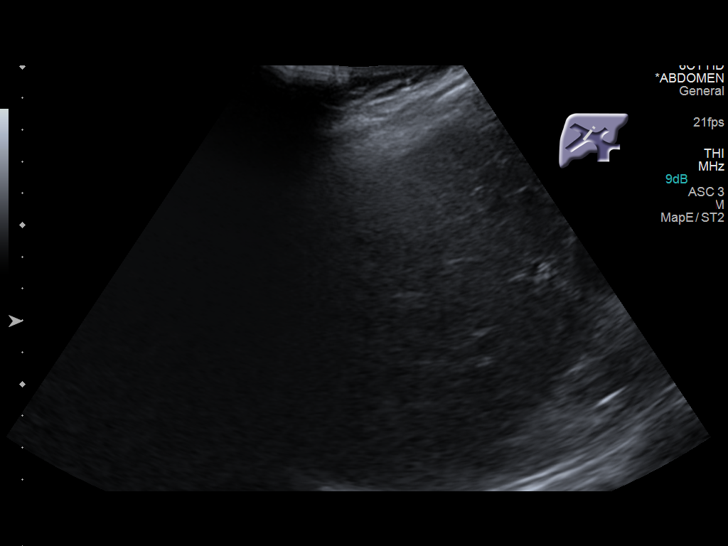
[im 48/89]
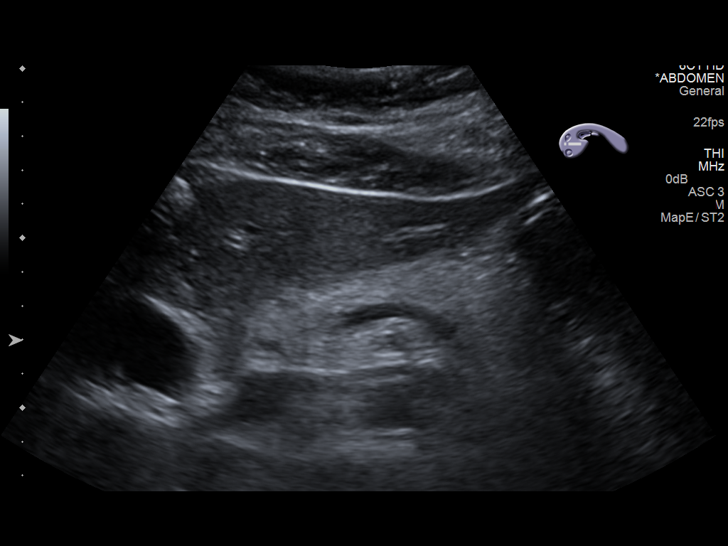
[im 56/89]
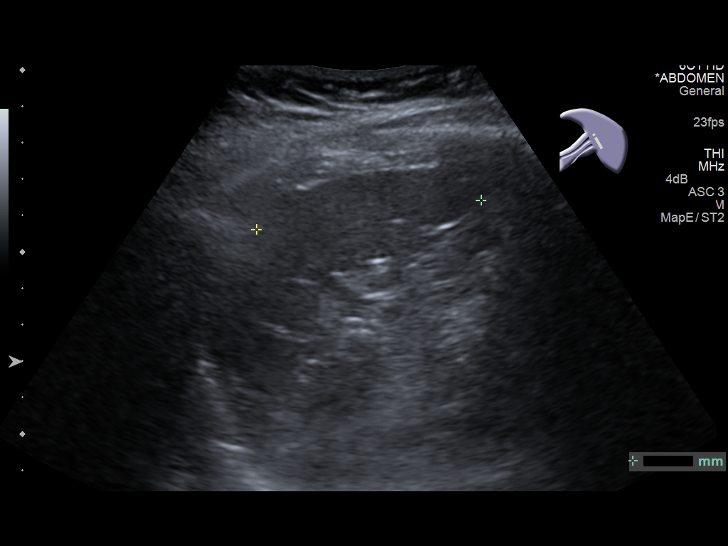
[im 59/89]
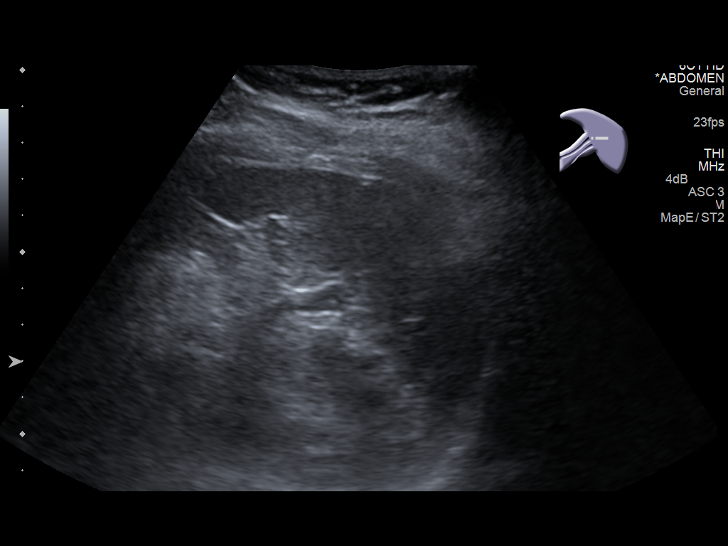
[im 67/89]
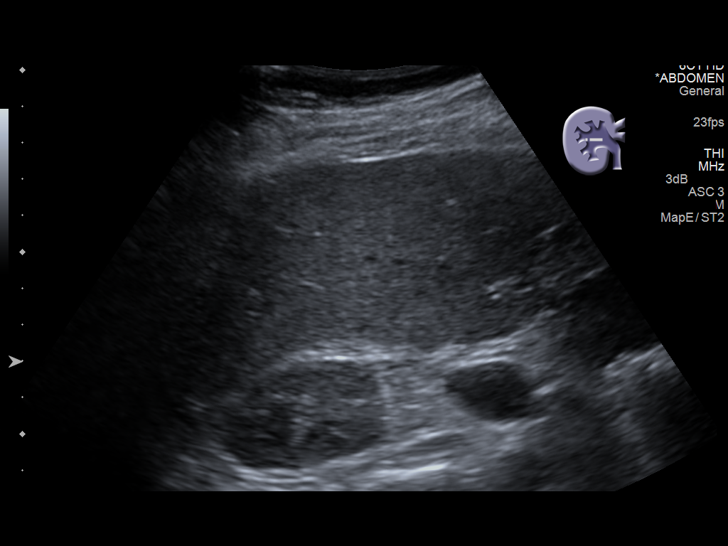
[im 74/89]
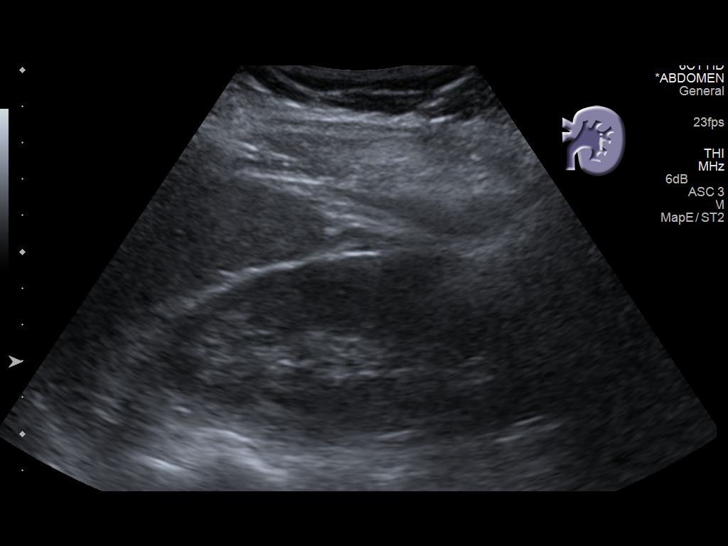
[im 81/89]
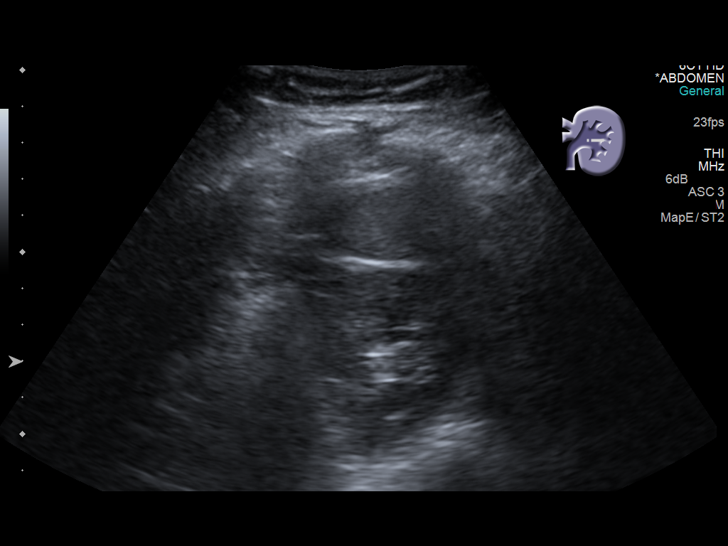
[im 89/89]
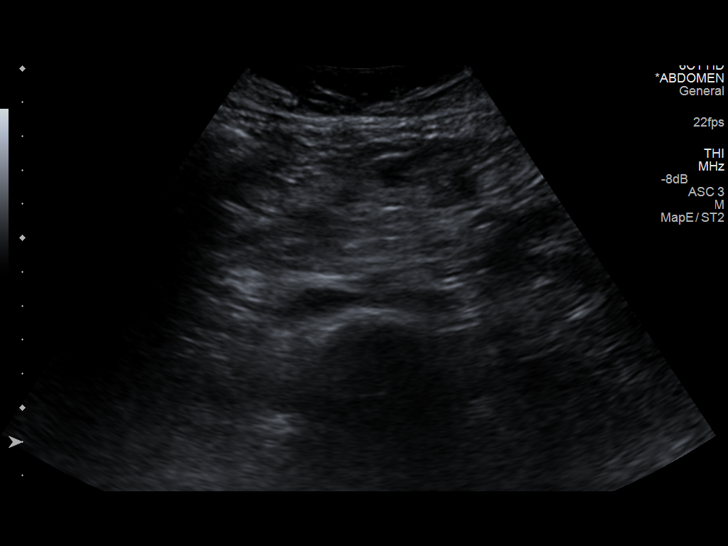

[14 of 25 positions shown; findings below may reference images not displayed]

FINDINGS: Gallbladder: Multiple small mobile stones are seen within the
gallbladder. The gallbladder is otherwise unremarkable in
appearance. No gallbladder wall thickening or pericholecystic fluid
is seen. No ultrasonographic Murphy's sign is elicited.

Common bile duct: Diameter: 0.6 cm, borderline normal in caliber.

Liver: No focal lesion identified. Parenchymal echogenicity remains
within normal limits.

IVC: No abnormality visualized.

Pancreas: Visualized portion unremarkable.

Spleen: Size and appearance within normal limits.

Right Kidney: Length: 11.9 cm. Echogenicity within normal limits. No
mass or hydronephrosis visualized.

Left Kidney: Length: 12.6 cm. Echogenicity within normal limits. No
mass or hydronephrosis visualized.

Abdominal aorta: No aneurysm visualized.

Other findings: None.
IMPRESSION: 1. Cholelithiasis; gallbladder otherwise unremarkable in appearance.
No evidence for obstruction or cholecystitis.
2. Otherwise unremarkable abdominal ultrasound.

## 2019-10-30 ENCOUNTER — Other Ambulatory Visit: Payer: Self-pay

## 2019-10-30 DIAGNOSIS — Z1231 Encounter for screening mammogram for malignant neoplasm of breast: Secondary | ICD-10-CM

## 2021-04-19 ENCOUNTER — Other Ambulatory Visit: Payer: Self-pay

## 2021-04-19 DIAGNOSIS — Z1231 Encounter for screening mammogram for malignant neoplasm of breast: Secondary | ICD-10-CM

## 2021-06-01 ENCOUNTER — Inpatient Hospital Stay: Admission: RE | Admit: 2021-06-01 | Payer: Self-pay | Source: Ambulatory Visit

## 2022-05-03 ENCOUNTER — Other Ambulatory Visit: Payer: Self-pay | Admitting: Obstetrics & Gynecology

## 2022-05-03 DIAGNOSIS — Z1231 Encounter for screening mammogram for malignant neoplasm of breast: Secondary | ICD-10-CM

## 2022-05-24 ENCOUNTER — Inpatient Hospital Stay: Admission: RE | Admit: 2022-05-24 | Payer: Self-pay | Source: Ambulatory Visit

## 2023-10-24 ENCOUNTER — Telehealth: Payer: Self-pay

## 2023-10-24 NOTE — Telephone Encounter (Signed)
 Telephoned patient using interpreter#[id number]. Patient answered and started screening process and then released the telephone call. [facility] (scholarship)
